# Patient Record
Sex: Female | Born: 1960 | Race: White | Hispanic: No | Marital: Married | Smoking: Former smoker
Health system: Southern US, Community
[De-identification: ages and names within clinical notes are randomized; demographics above are authoritative.]

## PROBLEM LIST (undated history)

## (undated) DIAGNOSIS — I499 Cardiac arrhythmia, unspecified: Secondary | ICD-10-CM

## (undated) DIAGNOSIS — R87619 Unspecified abnormal cytological findings in specimens from cervix uteri: Secondary | ICD-10-CM

## (undated) DIAGNOSIS — Z8679 Personal history of other diseases of the circulatory system: Secondary | ICD-10-CM

## (undated) DIAGNOSIS — S4990XA Unspecified injury of shoulder and upper arm, unspecified arm, initial encounter: Secondary | ICD-10-CM

## (undated) DIAGNOSIS — D649 Anemia, unspecified: Secondary | ICD-10-CM

## (undated) HISTORY — PX: OTHER SURGICAL HISTORY: SHX169

## (undated) HISTORY — DX: Unspecified injury of shoulder and upper arm, unspecified arm, initial encounter: S49.90XA

## (undated) HISTORY — DX: Cardiac arrhythmia, unspecified: I49.9

## (undated) HISTORY — DX: Unspecified abnormal cytological findings in specimens from cervix uteri: R87.619

## (undated) HISTORY — DX: Anemia, unspecified: D64.9

## (undated) HISTORY — DX: Personal history of other diseases of the circulatory system: Z86.79

---
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
## 1986-10-08 DIAGNOSIS — Z8774 Personal history of (corrected) congenital malformations of heart and circulatory system: Secondary | ICD-10-CM

## 1986-10-08 DIAGNOSIS — Z8679 Personal history of other diseases of the circulatory system: Secondary | ICD-10-CM

## 1986-10-08 HISTORY — DX: Personal history of (corrected) congenital malformations of heart and circulatory system: Z87.74

## 1986-10-08 HISTORY — DX: Personal history of other diseases of the circulatory system: Z86.79

## 1987-05-09 HISTORY — PX: ASD REPAIR: SHX258

## 1998-10-08 HISTORY — PX: WISDOM TOOTH EXTRACTION: SHX21

## 2003-05-03 ENCOUNTER — Encounter: Payer: Self-pay | Admitting: Internal Medicine

## 2009-10-08 HISTORY — PX: COLONOSCOPY: SHX174

## 2009-10-08 HISTORY — PX: POLYPECTOMY: SHX149

## 2010-01-20 ENCOUNTER — Encounter (INDEPENDENT_AMBULATORY_CARE_PROVIDER_SITE_OTHER): Payer: Self-pay | Admitting: *Deleted

## 2010-03-03 ENCOUNTER — Encounter (INDEPENDENT_AMBULATORY_CARE_PROVIDER_SITE_OTHER): Payer: Self-pay | Admitting: *Deleted

## 2010-03-08 ENCOUNTER — Ambulatory Visit: Payer: Self-pay | Admitting: Internal Medicine

## 2010-03-15 ENCOUNTER — Ambulatory Visit: Payer: Self-pay | Admitting: Internal Medicine

## 2010-03-19 ENCOUNTER — Encounter: Payer: Self-pay | Admitting: Internal Medicine

## 2010-07-12 ENCOUNTER — Encounter: Payer: Self-pay | Admitting: Internal Medicine

## 2010-07-13 ENCOUNTER — Encounter: Payer: Self-pay | Admitting: Internal Medicine

## 2010-07-14 ENCOUNTER — Encounter: Payer: Self-pay | Admitting: Internal Medicine

## 2010-07-19 ENCOUNTER — Encounter: Payer: Self-pay | Admitting: Internal Medicine

## 2010-07-20 ENCOUNTER — Telehealth: Payer: Self-pay | Admitting: Internal Medicine

## 2010-07-27 ENCOUNTER — Ambulatory Visit: Payer: Self-pay | Admitting: Internal Medicine

## 2010-07-27 ENCOUNTER — Encounter: Payer: Self-pay | Admitting: Internal Medicine

## 2010-07-27 DIAGNOSIS — R110 Nausea: Secondary | ICD-10-CM | POA: Insufficient documentation

## 2010-07-27 DIAGNOSIS — K5730 Diverticulosis of large intestine without perforation or abscess without bleeding: Secondary | ICD-10-CM | POA: Insufficient documentation

## 2010-07-27 DIAGNOSIS — R1013 Epigastric pain: Secondary | ICD-10-CM | POA: Insufficient documentation

## 2010-07-27 DIAGNOSIS — R079 Chest pain, unspecified: Secondary | ICD-10-CM | POA: Insufficient documentation

## 2010-07-27 DIAGNOSIS — R933 Abnormal findings on diagnostic imaging of other parts of digestive tract: Secondary | ICD-10-CM | POA: Insufficient documentation

## 2010-07-31 ENCOUNTER — Encounter: Payer: Self-pay | Admitting: Internal Medicine

## 2010-08-01 ENCOUNTER — Ambulatory Visit (HOSPITAL_COMMUNITY): Admission: RE | Admit: 2010-08-01 | Discharge: 2010-08-01 | Payer: Self-pay | Admitting: Internal Medicine

## 2010-08-09 ENCOUNTER — Telehealth: Payer: Self-pay | Admitting: Physician Assistant

## 2010-08-17 ENCOUNTER — Ambulatory Visit: Payer: Self-pay | Admitting: Internal Medicine

## 2010-11-07 NOTE — Procedures (Signed)
Summary: Colonoscopy  Patient: Vanessa Sanchez Note: All result statuses are Final unless otherwise noted.  Tests: (1) Colonoscopy (COL)   COL Colonoscopy           DONE     [facility]     [address]     [place], [place]  [address]           COLONOSCOPY PROCEDURE REPORT           PATIENT:  Vanessa Sanchez, Vanessa Sanchez  MR#:  [id number]     BIRTHDATE:  Jun 02, 1961, 49 yrs. old  GENDER:  female     ENDOSCOPIST:  Wilhemina Bonito. Eda Keys, MD     REF. BY:  Kari Baars, M.D.     PROCEDURE DATE:  03/15/2010     PROCEDURE:  Colonoscopy with snare polypectomy x 1     ASA CLASS:  Class I     INDICATIONS:  Routine Risk Screening     MEDICATIONS:   Fentanyl 100 mcg IV, Versed 12 mg IV, Benadryl 50     mg IV           DESCRIPTION OF PROCEDURE:   After the risks benefits and     alternatives of the procedure were thoroughly explained, informed     consent was obtained.  Digital rectal exam was performed and     revealed no abnormalities.   The LB PCF-H180AL [id number] endoscope     was introduced through the anus and advanced to the cecum, which     was identified by both the appendix and ileocecal valve, without     limitations.Time to cecum = 7:37 min. The quality of the prep was     good, using MoviPrep.  The instrument was then slowly withdrawn     (time = 18:12 min) as the colon was fully examined.     <<PROCEDUREIMAGES>>           FINDINGS:  A diminutive polyp was found in the sigmoid colon.     Polyp was snared without cautery. Retrieval was successful. snare     polyp  Moderate diverticulosis was found throughout the colon.     This was otherwise a normal examination of the colon.   Retroflexed     views in the rectum revealed no abnormalities.    The scope was     then withdrawn from the patient and the procedure completed.           COMPLICATIONS:  None     ENDOSCOPIC IMPRESSION:     1) Diminutive polyp in the sigmoid colon - removed     2) Moderate diverticulosis throughout the  colon     3) Otherwise normal examination           RECOMMENDATIONS:     1) Repeat colonoscopy in 5 years if polyp adenomatous; otherwise     10 years           ______________________________     Wilhemina Bonito. Eda Keys, MD           CC:  Kari Baars, MD; The Patient           n.     eSIGNED:   Wilhemina Bonito. Eda Keys at 03/15/2010 10:05 AM           Diona Foley, [id number]  Note: An exclamation mark (!) indicates a result that was not dispersed into the flowsheet. Document Creation Date: 03/15/2010 10:07 AM _______________________________________________________________________  (  1) Order result status: Final Collection or observation date-time: 03/15/2010 09:57 Requested date-time:  Receipt date-time:  Reported date-time:  Referring Physician:   Ordering Physician: Fransico Setters [phone]) Specimen Source:  Source: Launa Grill Order Number: [id number] Lab site:   Appended Document: Colonoscopy recall     Procedures Next Due Date:    Colonoscopy: 03/2020

## 2010-11-07 NOTE — Miscellaneous (Signed)
Summary: screening colon age--ch.  Clinical Lists Changes  Medications: Added new medication of MOVIPREP 100 GM  SOLR (PEG-KCL-NACL-NASULF-NA ASC-C) As directed - Signed Rx of MOVIPREP 100 GM  SOLR (PEG-KCL-NACL-NASULF-NA ASC-C) As directed;  #1 x 0;  Signed;  Entered by: Clide Cliff RN;  Authorized by: Hilarie Fredrickson MD;  Method used: [facility]  [place] [address] [place] [address], [place], [place]  [address], Ph: [phone] or [phone], Fax: [phone] Observations: Added new observation of ALLERGY REV: Done (03/08/2010 14:57)    Prescriptions: MOVIPREP 100 GM  SOLR (PEG-KCL-NACL-NASULF-NA ASC-C) As directed  #1 x 0   Entered by:   Clide Cliff RN   Authorized by:   Hilarie Fredrickson MD   Signed by:   Clide Cliff RN on 03/08/2010   Method used:   [facility]ronically to        [facility]  [place] [address] (retail)       [address]       [place], [place]  [address]       Ph: [phone] or [phone]       Fax: [phone]   RxID:   [id number]

## 2010-11-07 NOTE — Procedures (Signed)
Summary: Upper Endoscopy  Patient: [name] Note: All result statuses are Final unless otherwise noted.  Tests: (1) Upper Endoscopy (EGD)   EGD Upper Endoscopy       DONE     [facility]     [address]     [place], [place]  [address]           ENDOSCOPY PROCEDURE REPORT           PATIENT:  Vanessa, Sanchez  MR#:  [id number]     BIRTHDATE:  01-Sep-1961, 49 yrs. old  GENDER:  female           ENDOSCOPIST:  Wilhemina Bonito. Eda Keys, MD     Referred by:  Kari Baars, M.D.           PROCEDURE DATE:  08/17/2010     PROCEDURE:  EGD, diagnostic 43235     ASA CLASS:  Class I     INDICATIONS:  epigastric pain - fullness (some better since OV).     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  The LB GIF-H180 [id number] endoscope was     introduced through the mouth and advanced to the third portion of     the duodenum, without limitations.  The instrument was slowly     withdrawn as the mucosa was fully examined.     <<PROCEDUREIMAGES>>           The upper, middle, and distal third of the esophagus were     carefully inspected and no abnormalities were noted. The z-line     was well seen at the GEJ. The endoscope was pushed into the fundus     which was normal including a retroflexed view. The antrum,gastric     body, first and second part of the duodenum were unremarkable.     Retroflexed views revealed no abnormalities.    The scope was then     withdrawn from the patient and the procedure completed.           COMPLICATIONS:  None           ENDOSCOPIC IMPRESSION:     1) Normal EGD           RECOMMENDATIONS:     1) CONTINUE EXPECTANT MANAGENENT AS SYMPTOMS HAVE IMPROVED          ______________________________     Wilhemina Bonito. Eda Keys, MD           CC:  Kari Baars, MD, The Patient           n.     eSIGNED:   Wilhemina Bonito. Eda Keys at 08/17/2010 04:16 PM           Diona Foley, [id number]  Note: An exclamation mark (!) indicates a result that was not dispersed into the flowsheet. Document Creation Date: 08/17/2010 4:17 PM _______________________________________________________________________  (1) Order result status: Final Collection or observation date-time: 08/17/2010 16:09 Requested date-time:  Receipt date-time:  Reported date-time:  Referring Physician:  Ordering Physician: Fransico Setters [phone]) Specimen Source:  Source: Launa Grill Order Number: [id number] Lab site:

## 2010-11-07 NOTE — Letter (Signed)
Summary: [facility] Instructions  [facility]  [address] [place], [place] [address]   Phone: [phone]  Fax: [phone]       Vanessa Sanchez    1961-08-15    MRN: [id number]        Procedure Day Dorna Bloom: Wednesday 03/15/2010     Arrival Time: 8:00 am      Procedure Time: 9:00 am     Location of Procedure:                    _ x_  [facility] (4th Floor)                        PREPARATION FOR COLONOSCOPY WITH MOVIPREP   Starting 5 days prior to your procedure Friday 6/3 do not eat nuts, seeds, popcorn, corn, beans, peas,  salads, or any raw vegetables.  Do not take any fiber supplements (e.g. Metamucil, Citrucel, and Benefiber).  THE DAY BEFORE YOUR PROCEDURE         DATE: Tuesday 6/7  1.  Drink clear liquids the entire day-NO SOLID FOOD  2.  Do not drink anything colored red or purple.  Avoid juices with pulp.  No orange juice.  3.  Drink at least 64 oz. (8 glasses) of fluid/clear liquids during the day to prevent dehydration and help the prep work efficiently.  CLEAR LIQUIDS INCLUDE: Water Jello Ice Popsicles Tea (sugar ok, no milk/cream) Powdered fruit flavored drinks Coffee (sugar ok, no milk/cream) Gatorade Juice: apple, white grape, white cranberry  Lemonade Clear bullion, consomm, broth Carbonated beverages (any kind) Strained chicken noodle soup Hard Candy                             4.  In the morning, mix first dose of MoviPrep solution:    Empty 1 Pouch A and 1 Pouch B into the disposable container    Add lukewarm drinking water to the top line of the container. Mix to dissolve    Refrigerate (mixed solution should be used within 24 hrs)  5.  Begin drinking the prep at 5:00 p.m. The MoviPrep container is divided by 4 marks.   Every 15 minutes drink the solution down to the next mark (approximately 8 oz) until the full liter is complete.   6.  Follow completed prep with 16 oz of clear liquid of your choice (Nothing red  or purple).  Continue to drink clear liquids until bedtime.  7.  Before going to bed, mix second dose of MoviPrep solution:    Empty 1 Pouch A and 1 Pouch B into the disposable container    Add lukewarm drinking water to the top line of the container. Mix to dissolve    Refrigerate  THE DAY OF YOUR PROCEDURE      DATE: Wednesday 6/8  Beginning at 4:00 a.m. (5 hours before procedure):         1. Every 15 minutes, drink the solution down to the next mark (approx 8 oz) until the full liter is complete.  2. Follow completed prep with 16 oz. of clear liquid of your choice.    3. You may drink clear liquids until 7:00 am (2 HOURS BEFORE PROCEDURE).   MEDICATION INSTRUCTIONS  Unless otherwise instructed, you should take regular prescription medications with a small sip of water   as early as possible the morning of your  procedure.           OTHER INSTRUCTIONS  You will need a responsible adult at least 51 years of age to accompany you and drive you home.   This person must remain in the waiting room during your procedure.  Wear loose fitting clothing that is easily removed.  Leave jewelry and other valuables at home.  However, you may wish to bring a book to read or  an iPod/MP3 player to listen to music as you wait for your procedure to start.  Remove all body piercing jewelry and leave at home.  Total time from sign-in until discharge is approximately 2-3 hours.  You should go home directly after your procedure and rest.  You can resume normal activities the  day after your procedure.  The day of your procedure you should not:   Drive   Make legal decisions   Operate machinery   Drink alcohol   Return to work  You will receive specific instructions about eating, activities and medications before you leave.    The above instructions have been reviewed and explained to me by   Ernestine Conrad, RN______________________    I fully understand and can  verbalize these instructions _____________________________ Date _________

## 2010-11-07 NOTE — Procedures (Deleted)
Summary: LEC COLON   Colonoscopy  Procedure date:  05/03/2003  Findings:      Location:  [facility].   Patient Name: Vanessa Sanchez MRN: [id number] Procedure Procedures: Colonoscopy CPT: 10272.    with polypectomy. CPT: A3573898.  Personnel: Endoscopist: Wilhemina Bonito. Marina Goodell, MD.  Referred By: Valetta Mole Swords, MD.  Exam Location: Exam performed in Outpatient Clinic. Outpatient  Patient Consent: Procedure, Alternatives, Risks and Benefits discussed, consent obtained, from patient. Consent was obtained by the RN.  Indications  Average Risk Screening Routine.  History  Pre-Exam Physical: Performed May 03, 2003. Entire physical exam was normal.  Exam Exam: Extent of exam reached: Cecum, extent intended: Cecum.  The cecum was identified by appendiceal orifice and IC valve. Patient position: on left side. Colon retroflexion performed. Images taken. ASA Classification: II. Tolerance: excellent.  Monitoring: Pulse and BP monitoring, Oximetry used. Supplemental O2 given.  Colon Prep Used Golytely for colon prep. Prep results: good.  Sedation Meds: Patient assessed and found to be appropriate for moderate (conscious) sedation. Fentanyl 50 mcg. given IV. Versed 5 mg. given IV.  Findings NORMAL EXAM: Cecum to Rectum. Comments: internal hemorrhoids present.  POLYP: Ascending Colon, Maximum size: 6 mm. sessile polyp. Procedure:  snare with cautery, removed, retrieved, Polyp sent to pathology. ICD9: Colon Polyps: 211.3.  - DIVERTICULOSIS: Descending Colon to Sigmoid Colon. ICD9: Diverticulosis, Colon: 562.10.   Assessment Abnormal examination, see findings above.  Diagnoses: 211.3: Colon Polyps.  562.10: Diverticulosis, Colon.  455.0: Hemorrhoids, Internal.   Events  Unplanned Interventions: No intervention was required.  Unplanned Events: There were no complications. Plans Patient Education: Patient given standard instructions for: Polyps.   Disposition: After procedure patient sent to recovery. After recovery patient sent home.  Scheduling/Referral: Follow-Up prn.   cc: Valetta Mole. Swords, MD This report was created from the original endoscopy report, which was reviewed and signed by the above listed endoscopist.

## 2010-11-07 NOTE — Progress Notes (Signed)
Summary: Triage Severe abd pain  Phone Note Other Incoming   Caller: Malachi Bonds Dr Clelia Croft Summary of Call: Dr Clelia Croft is requesting a sooner appt, pt has abd and back pain and it "feels like something stuck in chest"  Nausea.  She has had CT and [place].  CT showed severe constipation.  US showed Numerous small hepatic lesions.  pt is requesting test to be run.  she has history of ASD repair open heart.  Neg hemosure.  She just started OTC Prilosec, Zofran.  Dr Marina Goodell do you want her to see extender?  Records are on your desk. Initial call taken by: Chales Abrahams CMA Duncan Dull),  July 20, 2010 9:00 AM Call placed by: Chales Abrahams CMA Duncan Dull),  July 20, 2010 8:54 AM  Follow-up for Phone Call        Extender appt next [facility] w/ Amy (I am supervising) Follow-up by: Hilarie Fredrickson MD,  July 20, 2010 9:21 AM  Additional Follow-up for Phone Call Additional follow up Details #1::        appt made Malachi Bonds will make pt aware Additional Follow-up by: Chales Abrahams CMA Duncan Dull),  July 20, 2010 9:28 AM    New/Updated Medications: PRILOSEC OTC 20 MG TBEC (OMEPRAZOLE MAGNESIUM) 1 by mouth once daily

## 2010-11-07 NOTE — Miscellaneous (Signed)
Summary: Orders Update-MRI ABD  Clinical Lists Changes  Orders: Added new Referral order of MRI Abdomen (MRI Abdomen) - Signed

## 2010-11-07 NOTE — Progress Notes (Signed)
Summary: MRI results  Phone Note Call from Patient Call back at [phone]   Call For: Vanessa Gip, PA Reason for Call: Lab or Test Results Summary of Call: MRI results 1wk ago. Initial call taken by: Leanor Kail [facility],  August 09, 2010 2:11 PM  Follow-up for Phone Call        Called pt and per [facility] PA, I told her that Dr Marina Goodell is  doing some research on some areas of the lver.  She has no history to suggest primary cancer or infection.  I advised Dr. Marina Goodell will discuss the MRI when she comes for the Endoscopy. She thanked me for calling and thanked Dr. Marina Goodell for his research into her matter. Follow-up by: Joselyn Glassman,  August 09, 2010 4:25 PM

## 2010-11-07 NOTE — Letter (Signed)
Summary: Previsit letter  [facility]  [address] [place], [place] [address]   Phone: [phone]  Fax: [phone]       01/20/2010 MRN: [id number]  Vanessa Sanchez [address] [place], [place]  [address]  Dear Ms. Vanessa Sanchez,  Welcome to the Gastroenterology Division at [facility].    You are scheduled to see a nurse for your pre-procedure visit on 03-08-10 at 3:00p.m. on the 3rd floor at [facility], [address]  We ask that you try to arrive at our office 15 minutes prior to your appointment time to allow for check-in.  Your nurse visit will consist of discussing your medical and surgical history, your immediate family medical history, and your medications.    Please bring a complete list of all your medications or, if you prefer, bring the medication bottles and we will list them.  We will need to be aware of both prescribed and over the counter drugs.  We will need to know exact dosage information as well.  If you are on blood thinners (Coumadin, Plavix, Aggrenox, Ticlid, etc.) please call our office today/prior to your appointment, as we need to consult with your physician about holding your medication.   Please be prepared to read and sign documents such as consent forms, a financial agreement, and acknowledgement forms.  If necessary, and with your consent, a friend or relative is welcome to sit-in on the nurse visit with you.  Please bring your insurance card so that we may make a copy of it.  If your insurance requires a referral to see a specialist, please bring your referral form from your primary care physician.  No co-pay is required for this nurse visit.     If you cannot keep your appointment, please call [phone] to cancel or reschedule prior to your appointment date.  This allows [place] the opportunity to schedule an appointment for another patient in need of care.    Thank you for choosing [facility] for your medical  needs.  We appreciate the opportunity to care for you.  Please visit [place] at our website  to learn more about our practice.                     Sincerely.                                                                                                                   The Gastroenterology Division

## 2010-11-07 NOTE — Assessment & Plan Note (Signed)
Summary: Severe abd pain (Dr. Marina Goodell pt)   History of Present Illness Visit Type: consult Primary GI MD: Yancey Flemings MD Primary Jamari Diana: Kari Baars, MD  Requesting Sharnee Douglass: Kari Baars, MD  Chief Complaint: Epigastric pain  History of Present Illness:   VERY NICE 50 Y.O FEMALE KNOWN TO DR. PERRY FROM RECENT COLONSCOPY WHICH WAS DONE IN 6/11 FOR SCREENING. SHE HAS MODERATE DIVERTICULOSIS,AND A DIMUNITIVE POLPY(PATH;POLYPOID COLONIC MUCOSA). SHE COMES IN TODAY WITH C/O EPIGASTRIC PAIN ,PRESENT OVER THE PAST 2-3 MONTHS.THIS PAIN  IS NOW CONSTANT. SHE DESCRIBES IT AS A PRESSURE,OR HEAVY FEELING IN HER LOWER CHEST AND EPIGASTRIUM. IT SEEMS WORSE IN THE EARLY MORNING, AND IS NOT AFFECTED BY by mouth INTAKE. SHE SAYS SHE "FEELS LIKE THERE IS SOMETHING STUCK IN THERE". SHE HAS NO HEARTBURN,NO DYSPHAGIA, OR ODYNOPHAGIA. APPETITE IS OK, WEIGHT STABLE. SHE WAS TREID ON PRILOSEC X 2 WEEKS WITH NO BENEFIT. SHE FEELS THE PAIN INTO HER BACK.,ALSO HAS HAD SOME VAGUE NAUSEA. Marland KitchenBM'S ARE NORMAL, NO HEME.  WORKUP PER DR. SHAW WITH ABDOMINAL US SHOWING NUMEROUS TINY HEPATIC  LESIONS FELT CONSISTENT WITH CYSTS,OTHERWISE NEGATIVE. CT ABDOMEN/PELVIS  SHOWS  CONSTIPATION,INUMERABLE SMALL HEPATIC HYPODENSITIES,MANY TOO SMALL TO CHARACTERIZE AND A ,SIMPLE APPEARING RIGHT UPPER RENAL CYST. LABS REVIEWED FROM 10/6 WITH NORMAL CBC,CMET,LIPASE. SHE FEELS VERY UNCOMFORTABLE ,AND THAT SOMETHING IS WRONG.   GI Review of Systems    Reports abdominal pain, acid reflux, chest pain, and  heartburn.     Location of  Abdominal pain: epigastric area.    Denies belching, bloating, dysphagia with liquids, dysphagia with solids, loss of appetite, nausea, vomiting, vomiting blood, weight loss, and  weight gain.        Denies anal fissure, black tarry stools, change in bowel habit, constipation, diarrhea, diverticulosis, fecal incontinence, heme positive stool, hemorrhoids, irritable bowel syndrome, jaundice, light color stool, liver  problems, rectal bleeding, and  rectal pain.    Current Medications (verified): 1)  Daily Multi  Tabs (Multiple Vitamins-Minerals) .Marland Kitchen.. 1 By Mouth Once Daily  Allergies (verified): No Known Drug Allergies  Past History:  Past Medical History: HX OF ATRIAL SEPTAL DEFECT-S/P REPAIR 1988 L 5 transverse process fracture DIVERTICULOSIS  Past Surgical History: S/P REPAIR ATRIAL SEPTAL DEFECT 1988  Family History: No FH of Colon Cancer:  Social History: [employer] Married Patient is a former smoker.  Alcohol Use - yes: wine daily Daily Caffeine Use: coffee daily  Illicit Drug Use - no Smoking Status:  quit Drug Use:  no  Review of Systems       The patient complains of shortness of breath.  The patient denies allergy/sinus, anemia, anxiety-new, arthritis/joint pain, back pain, blood in urine, breast changes/lumps, change in vision, confusion, cough, coughing up blood, depression-new, fainting, fatigue, fever, headaches-new, hearing problems, heart murmur, heart rhythm changes, itching, menstrual pain, muscle pains/cramps, night sweats, nosebleeds, pregnancy symptoms, skin rash, sleeping problems, sore throat, swelling of feet/legs, swollen lymph glands, thirst - excessive , urination - excessive , urination changes/pain, urine leakage, vision changes, and voice change.         SEE HPI  Vital Signs:  Patient profile:   50 year old female Height:      68 inches Weight:      128 pounds BMI:     19.53 BSA:     1.69 Pulse rate:   74 / minute Pulse rhythm:   regular BP sitting:   98 / 60  (left arm) Cuff size:   regular  Vitals Entered  By: Ok Anis CMA (July 27, 2010 1:50 PM)  Physical Exam  General:  Well developed, well nourished, no acute distress.healthy appearing.   Head:  Normocephalic and atraumatic. Eyes:  PERRLA, no icterus. Lungs:  Clear throughout to auscultation. Heart:  Regular rate and rhythm; no murmurs, rubs,  or bruits. Abdomen:  SOFT, QUITE TENDER  EPIGASTIUM, NO PALP MASS OR HSM, NO GUARDING,BS+,NO BRUIT Rectal:  NOT DONE Extremities:  No clubbing, cyanosis, edema or deformities noted. Neurologic:  Alert and  oriented x4;  grossly normal neurologically. Psych:  Alert and cooperative. Normal mood and affect.   Impression & Recommendations:  Problem # 1:  ABDOMINAL PAIN, EPIGASTRIC (ICD-789.06) Assessment New 49 Y.O FEMALE WITH 2-3 MONTH HX OF PROGRESSIVE EPIGASTRIC /CHEST PAIN,CONSTANT. CT AND US SHOWING NUMEROUS TINY HEPATIC HYPODENSITIES ?CYSTS  . ETIOLOGY OF PAIN IS NOT CLEAR R/O PUD ,GASTRIC OR ESOPHAGEAL LESION.   TRIAL OF HIGH DOSE PPI-NEXIUM 40 MG TWICE DAILY X 2 WEEKS (SAMPLES GIVEN) SCHEDULE FOR UPPER ENDOSCOPY WITH DR. PERRY .PROCEDURE DISCUSSED IN DETAIL WITH PT SCHEDULE FOR MRI OF ABDOMEN ATTN TO LIVER.  Orders: EGD (EGD)  Problem # 2:  DIVERTICULOSIS-COLON (ICD-562.10) Assessment: Comment Only  Other Orders: T-2 View CXR (20254YH)  Patient Instructions: 1)  Copy sent to : Kari Baars, MD  2)  You are scheduled for Endoscopy for November 10 at 4pm 3)  You are scheduled for a MRI at [facility] on Tuesday October 25 at 1:00pm and arrive at 12:45pm to register 4)  You are scheduled for a chest x-ray today  5)  Nexium samples have been given to take one by mouth two times a day  6)  Upper Endoscopy brochure given 7)  Conscious Sedation brochure given 8)  [facility] Patient Information Guide given to patient.  9)  The medication list was reviewed and reconciled.  All changed / newly prescribed medications were explained.  A complete medication list was provided to the patient / caregiver.

## 2010-11-07 NOTE — Letter (Signed)
Summary: Phone Note/[facility]  Phone Note/[facility]   Imported By: Sherian Rein 08/01/2010 08:37:09  _____________________________________________________________________  External Attachment:    Type:   Image     Comment:   External Document

## 2010-11-07 NOTE — Letter (Signed)
Summary: [facility]  [facility]   Imported By: Lester Conrath 08/04/2010 09:24:05  _____________________________________________________________________  External Attachment:    Type:   Image     Comment:   External Document

## 2010-11-07 NOTE — Letter (Signed)
Summary: EGD Instructions  [facility]  [address] [place], [place] [address]   Phone: [phone]  Fax: [phone]       Vanessa Sanchez    [birth year]    MRN: [id number]       Procedure Day /Date:Thursday/ 08-17-2010     Arrival Time:3pm     Procedure Time:4pm     Location of Procedure:                    [facility] (4th Floor)    PREPARATION FOR ENDOSCOPY   On Thursday/08-17-2010 THE DAY OF THE PROCEDURE:  1.   No solid foods, milk or milk products are allowed after midnight the night before your procedure.  2.   Do not drink anything colored red or purple.  Avoid juices with pulp.  No orange juice.  3.  You may drink clear liquids until 1pm which is 2 hours before your procedure.                                                                                                CLEAR LIQUIDS INCLUDE: Water Jello Ice Popsicles Tea (sugar ok, no milk/cream) Powdered fruit flavored drinks Coffee (sugar ok, no milk/cream) Gatorade Juice: apple, white grape, white cranberry  Lemonade Clear bullion, consomm, broth Carbonated beverages (any kind) Strained chicken noodle soup Hard Candy   MEDICATION INSTRUCTIONS Unless otherwise instructed, you should take regular prescription medications with a small sip of water as early as possible the morning of your procedure    OTHER INSTRUCTIONS  You will need a responsible adult at least 51 years of age to accompany you and drive you home.   This person must remain in the waiting room during your procedure.  Wear loose fitting clothing that is easily removed.  Leave jewelry and other valuables at home.  However, you may wish to bring a book to read or an iPod/MP3 player to listen to music as you wait for your procedure to start.  Remove all body piercing jewelry and leave at home.  Total time from sign-in until discharge is approximately 2-3 hours.  You should go home directly after your  procedure and rest.  You can resume normal activities the day after your procedure.  The day of your procedure you should not:   Drive   Make legal decisions   Operate machinery   Drink alcohol   Return to work  You will receive specific instructions about eating, activities and medications before you leave.    The above instructions have been reviewed and explained to me by   _______________________    I fully understand and can verbalize these instructions _____________________________ Date _________

## 2010-11-07 NOTE — Letter (Signed)
Summary: Patient Notice- Polyp Results  [facility]  [address] [place], [place] [address]   Phone: [phone]  Fax: [phone]        March 19, 2010 MRN: [id number]    Vanessa Sanchez [address] [place], [place]  [address]    Dear [name],  I am pleased to inform you that the colon polyp(s) removed during your recent colonoscopy was (were) found to be benign (no cancer detected) upon pathologic examination.  I recommend you have a repeat colonoscopy examination in 10 years to look for recurrent polyps, as having colon polyps increases your risk for having recurrent polyps or even colon cancer in the future.  Should you develop new or worsening symptoms of abdominal pain, bowel habit changes or bleeding from the rectum or bowels, please schedule an evaluation with either your primary care physician or with me.  Additional information/recommendations:  __ No further action with gastroenterology is needed at this time. Please      follow-up with your primary care physician for your other healthcare      needs.  Please call us if you are having persistent problems or have questions about your condition that have not been fully answered at this time.  Sincerely,  Hilarie Fredrickson MD  This letter has been electronically signed by your physician.  Appended Document: Patient Notice- Polyp Results letter mailed.

## 2010-11-07 NOTE — Letter (Signed)
Summary: [facility]  [facility]   Imported By: Sherian Rein 08/01/2010 08:36:10  _____________________________________________________________________  External Attachment:    Type:   Image     Comment:   External Document

## 2010-11-10 NOTE — Letter (Signed)
Summary: Pt's Hx/[facility]  Pt's Hx/[facility]   Imported By: Sherian Rein 08/01/2010 08:31:23  _____________________________________________________________________  External Attachment:    Type:   Image     Comment:   External Document

## 2011-09-14 ENCOUNTER — Emergency Department (HOSPITAL_COMMUNITY): Payer: No Typology Code available for payment source

## 2011-09-14 ENCOUNTER — Encounter: Payer: Self-pay | Admitting: Emergency Medicine

## 2011-09-14 ENCOUNTER — Emergency Department (HOSPITAL_COMMUNITY)
Admission: EM | Admit: 2011-09-14 | Discharge: 2011-09-14 | Disposition: A | Discharge status: Home / Self Care | Payer: No Typology Code available for payment source | Attending: Emergency Medicine | Admitting: Emergency Medicine

## 2011-09-14 DIAGNOSIS — S52599A Other fractures of lower end of unspecified radius, initial encounter for closed fracture: Secondary | ICD-10-CM | POA: Insufficient documentation

## 2011-09-14 DIAGNOSIS — R079 Chest pain, unspecified: Secondary | ICD-10-CM | POA: Insufficient documentation

## 2011-09-14 DIAGNOSIS — M545 Low back pain, unspecified: Secondary | ICD-10-CM | POA: Insufficient documentation

## 2011-09-14 DIAGNOSIS — Y92410 Unspecified street and highway as the place of occurrence of the external cause: Secondary | ICD-10-CM | POA: Insufficient documentation

## 2011-09-14 DIAGNOSIS — M542 Cervicalgia: Secondary | ICD-10-CM | POA: Insufficient documentation

## 2011-09-14 DIAGNOSIS — M25569 Pain in unspecified knee: Secondary | ICD-10-CM | POA: Insufficient documentation

## 2011-09-14 DIAGNOSIS — S52509A Unspecified fracture of the lower end of unspecified radius, initial encounter for closed fracture: Secondary | ICD-10-CM

## 2011-09-14 MED ORDER — MORPHINE SULFATE 2 MG/ML IJ SOLN
INTRAMUSCULAR | Status: AC
  Filled 2011-09-14: qty 1

## 2011-09-14 MED ORDER — OXYCODONE-ACETAMINOPHEN 5-325 MG PO TABS: 1.0000 | ORAL_TABLET | ORAL | Status: AC | PRN

## 2011-09-14 MED ORDER — MORPHINE SULFATE 4 MG/ML IJ SOLN
6.0000 mg | Freq: Once | INTRAMUSCULAR | Status: AC
  Administered 2011-09-14: 4 mg via INTRAVENOUS

## 2011-09-14 MED ORDER — MORPHINE SULFATE 2 MG/ML IJ SOLN
INTRAMUSCULAR | Status: AC
  Filled 2011-09-14: qty 2

## 2011-09-14 MED ORDER — MORPHINE SULFATE 4 MG/ML IJ SOLN
6.0000 mg | Freq: Once | INTRAMUSCULAR | Status: AC
  Administered 2011-09-14: 6 mg via INTRAVENOUS
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
## 2011-09-14 NOTE — ED Provider Notes (Signed)
Medical screening examination/treatment/procedure(s) were performed by non-physician practitioner and as supervising physician I was immediately available for consultation/collaboration.  Doug Sou, MD 09/14/11 [phone]
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
## 2011-10-09 DIAGNOSIS — S4990XA Unspecified injury of shoulder and upper arm, unspecified arm, initial encounter: Secondary | ICD-10-CM

## 2011-10-09 HISTORY — DX: Unspecified injury of shoulder and upper arm, unspecified arm, initial encounter: S49.90XA

## 2012-09-13 HISTORY — PX: OTHER SURGICAL HISTORY: SHX169

## 2013-12-14 ENCOUNTER — Encounter: Payer: Self-pay | Admitting: Obstetrics & Gynecology

## 2013-12-15 ENCOUNTER — Ambulatory Visit: Payer: Self-pay | Admitting: Obstetrics & Gynecology

## 2013-12-15 ENCOUNTER — Ambulatory Visit: Payer: Self-pay | Admitting: Obstetrics and Gynecology

## 2013-12-16 ENCOUNTER — Telehealth: Payer: Self-pay | Admitting: Obstetrics & Gynecology

## 2013-12-16 NOTE — Telephone Encounter (Signed)
Patient says she is returning a call to [place] from yesterday. Patient says this is regarding changing an appointment with Dr. Hyacinth MeekerMiller.

## 2013-12-18 NOTE — Telephone Encounter (Signed)
AEX rescheduled to 01-11-14 at 4pm with Dr Hyacinth MeekerMiller.  Routing to provider for final review. Patient agreeable to disposition. Will close encounter

## 2014-01-11 ENCOUNTER — Encounter: Payer: Self-pay | Admitting: Obstetrics & Gynecology

## 2014-01-11 ENCOUNTER — Ambulatory Visit (INDEPENDENT_AMBULATORY_CARE_PROVIDER_SITE_OTHER): Payer: BC Managed Care – PPO | Admitting: Obstetrics & Gynecology

## 2014-01-11 VITALS — BP 102/70 | HR 56 | Resp 16 | Ht 66.5 in | Wt 123.0 lb

## 2014-01-11 DIAGNOSIS — Z0000 Encounter for general adult medical examination without abnormal findings: Secondary | ICD-10-CM

## 2014-01-11 DIAGNOSIS — Z01419 Encounter for gynecological examination (general) (routine) without abnormal findings: Secondary | ICD-10-CM

## 2014-01-11 LAB — POCT URINALYSIS DIPSTICK
Bilirubin, UA: NEGATIVE
Blood, UA: NEGATIVE
GLUCOSE UA: NEGATIVE
Ketones, UA: NEGATIVE
LEUKOCYTES UA: NEGATIVE
NITRITE UA: NEGATIVE
Protein, UA: NEGATIVE
UROBILINOGEN UA: NEGATIVE
pH, UA: 5

## 2014-01-11 LAB — HEMOGLOBIN, FINGERSTICK: HEMOGLOBIN, FINGERSTICK: 12.9 g/dL (ref 12.0–16.0)

## 2014-01-11 MED ORDER — MEFLOQUINE HCL 250 MG PO TABS: 250.0000 mg | ORAL_TABLET | ORAL | Status: DC

## 2014-01-11 MED ORDER — CIPROFLOXACIN HCL 500 MG PO TABS: 500.0000 mg | ORAL_TABLET | Freq: Two times a day (BID) | ORAL | Status: DC
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
## 2014-01-11 NOTE — Patient Instructions (Signed)

## 2014-08-09 ENCOUNTER — Encounter: Payer: Self-pay | Admitting: Obstetrics & Gynecology

## 2015-01-27 ENCOUNTER — Emergency Department (HOSPITAL_COMMUNITY)
Admission: EM | Admit: 2015-01-27 | Discharge: 2015-01-27 | Disposition: A | Discharge status: Home / Self Care | Payer: BLUE CROSS/BLUE SHIELD | Attending: Emergency Medicine | Admitting: Emergency Medicine

## 2015-01-27 ENCOUNTER — Encounter (HOSPITAL_COMMUNITY): Payer: Self-pay | Admitting: Cardiology

## 2015-01-27 DIAGNOSIS — Z8679 Personal history of other diseases of the circulatory system: Secondary | ICD-10-CM | POA: Insufficient documentation

## 2015-01-27 DIAGNOSIS — Z862 Personal history of diseases of the blood and blood-forming organs and certain disorders involving the immune mechanism: Secondary | ICD-10-CM | POA: Diagnosis not present

## 2015-01-27 DIAGNOSIS — Z87891 Personal history of nicotine dependence: Secondary | ICD-10-CM | POA: Diagnosis not present

## 2015-01-27 DIAGNOSIS — Z79899 Other long term (current) drug therapy: Secondary | ICD-10-CM | POA: Insufficient documentation

## 2015-01-27 DIAGNOSIS — Z792 Long term (current) use of antibiotics: Secondary | ICD-10-CM | POA: Diagnosis not present

## 2015-01-27 DIAGNOSIS — I499 Cardiac arrhythmia, unspecified: Secondary | ICD-10-CM | POA: Diagnosis not present

## 2015-01-27 DIAGNOSIS — R040 Epistaxis: Secondary | ICD-10-CM

## 2015-01-27 MED ORDER — MUPIROCIN CALCIUM 2 % NA OINT: TOPICAL_OINTMENT | NASAL | Status: DC

## 2015-01-27 MED ORDER — OXYMETAZOLINE HCL 0.05 % NA SOLN: 1.0000 | Freq: Once | NASAL | Status: DC

## 2015-01-27 MED ORDER — OXYMETAZOLINE HCL 0.05 % NA SOLN
1.0000 | Freq: Once | NASAL | Status: AC
  Administered 2015-01-27: 1 via NASAL
  Filled 2015-01-27: qty 15

## 2015-01-27 NOTE — ED Notes (Signed)
Patient states 3 x nosebleeds in the last week.   Had most recent one today and it lasted approximately 15 minutes.  Patient states no history of allergies, nosebleeds, high bp.   No bleeding at this time.
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
## 2015-01-27 NOTE — ED Notes (Signed)
Pt reports that she has had 3 nose bleeds this week. Reports no hx of the same. Denies any blood thinners. States she called her PCP and was told to come here for further evaluation. Reports a headache

## 2015-01-27 NOTE — Discharge Instructions (Signed)
You have an appointment tomorrow with Dr. Jearld Fenton at 2 PM at [facility] ear, nose and throat.. If you develop a nosebleed again soaked gauze with Afrin and placed in the left nostril and apply steady pressure for 20 minutes without letting up. He will need to apply Bactroban ointment to the nose twice daily for the next 2 weeks. Please read the information below on nasal hygiene. You need to be using saline and then nose up to 6 times a day.\  Nosebleed Nosebleeds can be caused by many conditions, including trauma, infections, polyps, foreign bodies, dry mucous membranes or climate, medicines, and air conditioning. Most nosebleeds occur in the front of the nose. Because of this location, most nosebleeds can be controlled by pinching the nostrils gently and continuously for at least 10 to 20 minutes. The long, continuous pressure allows enough time for the blood to clot. If pressure is released during that 10 to 20 minute time period, the process may have to be started again. The nosebleed may stop by itself or quit with pressure, or it may need concentrated heating (cautery) or pressure from packing. HOME CARE INSTRUCTIONS   If your nose was packed, try to maintain the pack inside until your health care provider removes it. If a gauze pack was used and it starts to fall out, gently replace it or cut the end off. Do not cut if a balloon catheter was used to pack the nose. Otherwise, do not remove unless instructed.  Avoid blowing your nose for 12 hours after treatment. This could dislodge the pack or clot and start the bleeding again.  If the bleeding starts again, sit up and bend forward, gently pinching the front half of your nose continuously for 20 minutes.  If bleeding was caused by dry mucous membranes, use over-the-counter saline nasal spray or gel. This will keep the mucous membranes moist and allow them to heal. If you must use a lubricant, choose the water-soluble variety. Use it only sparingly  and not within several hours of lying down.  Do not use petroleum jelly or mineral oil, as these may drip into the lungs and cause serious problems.  Maintain humidity in your home by using less air conditioning or by using a humidifier.  Do not use aspirin or medicines which make bleeding more likely. Your health care provider can give you recommendations on this.  Resume normal activities as you are able, but try to avoid straining, lifting, or bending at the waist for several days.  If the nosebleeds become recurrent and the cause is unknown, your health care provider may suggest laboratory tests. SEEK MEDICAL CARE IF: You have a fever. SEEK IMMEDIATE MEDICAL CARE IF:   Bleeding recurs and cannot be controlled.  There is unusual bleeding from or bruising on other parts of the body.  Nosebleeds continue.  There is any worsening of the condition which originally brought you in.  You become light-headed, feel faint, become sweaty, or vomit blood. MAKE SURE YOU:   Understand these instructions.  Will watch your condition.  Will get help right away if you are not doing well or get worse. Document Released: 07/04/2005 Document Revised: 02/08/2014 Document Reviewed: 08/25/2009 [facility] Patient Information 2015 [place], [place]. This information is not intended to replace advice given to you by your health care provider. Make sure you discuss any questions you have with your health care provider.  Nasal Hygiene The nose has many positive effects on the air you breathe in that you  may not be aware of. - Temperature regulation - Filtration and removal of particulate matter - Humidification - Defense against infections There are several things you can do to help keep your nose healthy. Foremost is nasal hygiene. This will help with your nose's natural function and keep it moist and healthy. Techniques to accomplish this are outlined below. These will help with nasal dryness, nasal  crusting, and nose bleeds. They also assist with clearing thick mucus that cause you to blow your nose frequently and may be associated with thick postnasal drip.  1. Use nasal saline daily. You can buy small bottles of this over-the-counter at the drug store or grocery store. Some brand names are: [address], [employer] Mist, [place]. Apply 2-3 sprays each nostril several times a day. If your nose feels dry or have had recent nasal surgery, try to use it every couple of hours. There is no medicine in it so it can be used as often as you like. Do NOT use the sprays containing decongestants. The appropriate way to apply nasal sprays: Place the nozzle just inside your nostril and point it towards the corner of your eye. Often, it is helpful to use the right-hand to spray into the left nasal cavity and use the left-hand to spray into the right side.  2. Use nasal saline irrigations and flushing.SinuCleanse, Simply Saline, [place]. Use this 1-2 times a day. We can also provide you with a recipe to use at home. Just ask us. To prevent reintroducing bacteria back into your nose, please keep your irrigation equipment clean and dry between uses. Throw away and replace reusable irrigation equipment every 3 weeks.   3. Use Vaseline petroleum jelly or Aquaphor. You can apply this gently to each nostril 2-3 times a day to promote moisturization for your nose. You may also use triple antibiotic ointment such as Neosporin or Bacitracin. These can all be bought over-the-counter.  4. Consider other nasal emollients. A few preparations are available over-thecounter. Ponaris, Nose Better, Pretz. Ask your pharmacist what is available. Also, some nasal saline sprays have additives such as aloe and these are helpful.  5. Consider using a humidifier at home. If your nose feels dry and/or you have frequent nose bleeds, you can buy a humidifier for your home. Be cautious in using these if you have mold allergies.  6. Avoid  excessive manual manipulation of your nose and nostrils. Frequent rubbing of your nostrils and the passing of tissues or fingers in your nostrils may aggravate nasal irritation from dryness and nose bleeds.

## 2015-05-12 ENCOUNTER — Encounter: Payer: Self-pay | Admitting: Internal Medicine

## 2015-05-20 ENCOUNTER — Ambulatory Visit (INDEPENDENT_AMBULATORY_CARE_PROVIDER_SITE_OTHER): Payer: BLUE CROSS/BLUE SHIELD | Admitting: Obstetrics and Gynecology

## 2015-05-20 ENCOUNTER — Encounter: Payer: Self-pay | Admitting: Obstetrics and Gynecology

## 2015-05-20 VITALS — BP 120/72 | HR 56 | Resp 16 | Ht 66.75 in | Wt 128.0 lb

## 2015-05-20 DIAGNOSIS — Z01419 Encounter for gynecological examination (general) (routine) without abnormal findings: Secondary | ICD-10-CM | POA: Diagnosis not present

## 2015-05-20 DIAGNOSIS — Z0000 Encounter for general adult medical examination without abnormal findings: Secondary | ICD-10-CM | POA: Diagnosis not present

## 2015-05-20 LAB — POCT URINALYSIS DIPSTICK
Bilirubin, UA: NEGATIVE
Blood, UA: NEGATIVE
Glucose, UA: NEGATIVE
Ketones, UA: NEGATIVE
Nitrite, UA: NEGATIVE
Protein, UA: NEGATIVE
Urobilinogen, UA: NEGATIVE
pH, UA: 7
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
## 2015-05-20 NOTE — Patient Instructions (Signed)

## 2015-05-26 LAB — IPS PAP TEST WITH HPV

## 2015-09-07 ENCOUNTER — Other Ambulatory Visit (HOSPITAL_COMMUNITY): Payer: Self-pay | Admitting: Respiratory Therapy

## 2015-09-07 DIAGNOSIS — R0609 Other forms of dyspnea: Secondary | ICD-10-CM

## 2015-10-18 ENCOUNTER — Encounter (HOSPITAL_COMMUNITY): Payer: BLUE CROSS/BLUE SHIELD

## 2016-01-27 ENCOUNTER — Telehealth: Payer: Self-pay | Admitting: Obstetrics and Gynecology

## 2016-01-27 NOTE — Telephone Encounter (Signed)
Left patient a message to call back to reschedule a future appointment that was cancelled by the provider. °

## 2016-05-23 ENCOUNTER — Ambulatory Visit (INDEPENDENT_AMBULATORY_CARE_PROVIDER_SITE_OTHER): Payer: PRIVATE HEALTH INSURANCE | Admitting: Obstetrics and Gynecology

## 2016-05-23 ENCOUNTER — Encounter: Payer: Self-pay | Admitting: Obstetrics and Gynecology

## 2016-05-23 VITALS — BP 116/70 | HR 60 | Resp 14 | Ht 66.75 in | Wt 130.2 lb

## 2016-05-23 DIAGNOSIS — Z0000 Encounter for general adult medical examination without abnormal findings: Secondary | ICD-10-CM | POA: Diagnosis not present

## 2016-05-23 DIAGNOSIS — R300 Dysuria: Secondary | ICD-10-CM | POA: Diagnosis not present

## 2016-05-23 DIAGNOSIS — Z01419 Encounter for gynecological examination (general) (routine) without abnormal findings: Secondary | ICD-10-CM | POA: Diagnosis not present

## 2016-05-23 LAB — POCT URINALYSIS DIPSTICK
Bilirubin, UA: NEGATIVE
Glucose, UA: NEGATIVE
Ketones, UA: NEGATIVE
Nitrite, UA: NEGATIVE
PH UA: 6
RBC UA: NEGATIVE
UROBILINOGEN UA: NEGATIVE

## 2016-05-23 MED ORDER — SULFAMETHOXAZOLE-TRIMETHOPRIM 800-160 MG PO TABS: 1.0000 | ORAL_TABLET | Freq: Two times a day (BID) | ORAL | 0 refills | Status: DC

## 2016-05-23 NOTE — Patient Instructions (Signed)
Health Maintenance, Female Adopting a healthy lifestyle and getting preventive care can go a long way to promote health and wellness. Talk with your health care provider about what schedule of regular examinations is right for you. This is a good chance for you to check in with your provider about disease prevention and staying healthy. In between checkups, there are plenty of things you can do on your own. Experts have done a lot of research about which lifestyle changes and preventive measures are most likely to keep you healthy. Ask your health care provider for more information. WEIGHT AND DIET  Eat a healthy diet  Be sure to include plenty of vegetables, fruits, low-fat dairy products, and lean protein.  Do not eat a lot of foods high in solid fats, added sugars, or salt.  Get regular exercise. This is one of the most important things you can do for your health.  Most adults should exercise for at least 150 minutes each week. The exercise should increase your heart rate and make you sweat (moderate-intensity exercise).  Most adults should also do strengthening exercises at least twice a week. This is in addition to the moderate-intensity exercise.  Maintain a healthy weight  Body mass index (BMI) is a measurement that can be used to identify possible weight problems. It estimates body fat based on height and weight. Your health care provider can help determine your BMI and help you achieve or maintain a healthy weight.  For females 45 years of age and older:   A BMI below 18.5 is considered underweight.  A BMI of 18.5 to 24.9 is normal.  A BMI of 25 to 29.9 is considered overweight.  A BMI of 30 and above is considered obese.  Watch levels of cholesterol and blood lipids  You should start having your blood tested for lipids and cholesterol at 56 years of age, then have this test every 5 years.  You may need to have your cholesterol levels checked more often if:  Your lipid  or cholesterol levels are high.  You are older than 56 years of age.  You are at high risk for heart disease.  CANCER SCREENING   Lung Cancer  Lung cancer screening is recommended for adults 81-39 years old who are at high risk for lung cancer because of a history of smoking.  A yearly low-dose CT scan of the lungs is recommended for people who:  Currently smoke.  Have quit within the past 15 years.  Have at least a 30-pack-year history of smoking. A pack year is smoking an average of one pack of cigarettes a day for 1 year.  Yearly screening should continue until it has been 15 years since you quit.  Yearly screening should stop if you develop a health problem that would prevent you from having lung cancer treatment.  Breast Cancer  Practice breast self-awareness. This means understanding how your breasts normally appear and feel.  It also means doing regular breast self-exams. Let your health care provider know about any changes, no matter how small.  If you are in your 20s or 30s, you should have a clinical breast exam (CBE) by a health care provider every 1-3 years as part of a regular health exam.  If you are 60 or older, have a CBE every year. Also consider having a breast X-ray (mammogram) every year.  If you have a family history of breast cancer, talk to your health care provider about genetic screening.  If you  are at high risk for breast cancer, talk to your health care provider about having an MRI and a mammogram every year.  Breast cancer gene (BRCA) assessment is recommended for women who have family members with BRCA-related cancers. BRCA-related cancers include:  Breast.  Ovarian.  Tubal.  Peritoneal cancers.  Results of the assessment will determine the need for genetic counseling and BRCA1 and BRCA2 testing. Cervical Cancer Your health care provider may recommend that you be screened regularly for cancer of the pelvic organs (ovaries, uterus, and  vagina). This screening involves a pelvic examination, including checking for microscopic changes to the surface of your cervix (Pap test). You may be encouraged to have this screening done every 3 years, beginning at age 21.  For women ages 30-65, health care providers may recommend pelvic exams and Pap testing every 3 years, or they may recommend the Pap and pelvic exam, combined with testing for human papilloma virus (HPV), every 5 years. Some types of HPV increase your risk of cervical cancer. Testing for HPV may also be done on women of any age with unclear Pap test results.  Other health care providers may not recommend any screening for nonpregnant women who are considered low risk for pelvic cancer and who do not have symptoms. Ask your health care provider if a screening pelvic exam is right for you.  If you have had past treatment for cervical cancer or a condition that could lead to cancer, you need Pap tests and screening for cancer for at least 20 years after your treatment. If Pap tests have been discontinued, your risk factors (such as having a new sexual partner) need to be reassessed to determine if screening should resume. Some women have medical problems that increase the chance of getting cervical cancer. In these cases, your health care provider may recommend more frequent screening and Pap tests. Colorectal Cancer  This type of cancer can be detected and often prevented.  Routine colorectal cancer screening usually begins at 56 years of age and continues through 56 years of age.  Your health care provider may recommend screening at an earlier age if you have risk factors for colon cancer.  Your health care provider may also recommend using home test kits to check for hidden blood in the stool.  A small camera at the end of a tube can be used to examine your colon directly (sigmoidoscopy or colonoscopy). This is done to check for the earliest forms of colorectal  cancer.  Routine screening usually begins at age 50.  Direct examination of the colon should be repeated every 5-10 years through 56 years of age. However, you may need to be screened more often if early forms of precancerous polyps or small growths are found. Skin Cancer  Check your skin from head to toe regularly.  Tell your health care provider about any new moles or changes in moles, especially if there is a change in a mole's shape or color.  Also tell your health care provider if you have a mole that is larger than the size of a pencil eraser.  Always use sunscreen. Apply sunscreen liberally and repeatedly throughout the day.  Protect yourself by wearing long sleeves, pants, a wide-brimmed hat, and sunglasses whenever you are outside. HEART DISEASE, DIABETES, AND HIGH BLOOD PRESSURE   High blood pressure causes heart disease and increases the risk of stroke. High blood pressure is more likely to develop in:  People who have blood pressure in the high end   of the normal range (130-139/85-89 mm Hg).  People who are overweight or obese.  People who are African American.  If you are 38-23 years of age, have your blood pressure checked every 3-5 years. If you are 61 years of age or older, have your blood pressure checked every year. You should have your blood pressure measured twice--once when you are at a hospital or clinic, and once when you are not at a hospital or clinic. Record the average of the two measurements. To check your blood pressure when you are not at a hospital or clinic, you can use:  An automated blood pressure machine at a pharmacy.  A home blood pressure monitor.  If you are between 45 years and 39 years old, ask your health care provider if you should take aspirin to prevent strokes.  Have regular diabetes screenings. This involves taking a blood sample to check your fasting blood sugar level.  If you are at a normal weight and have a low risk for diabetes,  have this test once every three years after 56 years of age.  If you are overweight and have a high risk for diabetes, consider being tested at a younger age or more often. PREVENTING INFECTION  Hepatitis B  If you have a higher risk for hepatitis B, you should be screened for this virus. You are considered at high risk for hepatitis B if:  You were born in a country where hepatitis B is common. Ask your health care provider which countries are considered high risk.  Your parents were born in a high-risk country, and you have not been immunized against hepatitis B (hepatitis B vaccine).  You have HIV or AIDS.  You use needles to inject street drugs.  You live with someone who has hepatitis B.  You have had sex with someone who has hepatitis B.  You get hemodialysis treatment.  You take certain medicines for conditions, including cancer, organ transplantation, and autoimmune conditions. Hepatitis C  Blood testing is recommended for:  Everyone born from [birth year] through 1966.  Anyone with known risk factors for hepatitis C. Sexually transmitted infections (STIs)  You should be screened for sexually transmitted infections (STIs) including gonorrhea and chlamydia if:  You are sexually active and are younger than 56 years of age.  You are older than 56 years of age and your health care provider tells you that you are at risk for this type of infection.  Your sexual activity has changed since you were last screened and you are at an increased risk for chlamydia or gonorrhea. Ask your health care provider if you are at risk.  If you do not have HIV, but are at risk, it may be recommended that you take a prescription medicine daily to prevent HIV infection. This is called pre-exposure prophylaxis (PrEP). You are considered at risk if:  You are sexually active and do not regularly use condoms or know the HIV status of your partner(s).  You take drugs by injection.  You are sexually  active with a partner who has HIV. Talk with your health care provider about whether you are at high risk of being infected with HIV. If you choose to begin PrEP, you should first be tested for HIV. You should then be tested every 3 months for as long as you are taking PrEP.  PREGNANCY   If you are premenopausal and you may become pregnant, ask your health care provider about preconception counseling.  If you may  become pregnant, take 400 to 800 micrograms (mcg) of folic acid every day.  If you want to prevent pregnancy, talk to your health care provider about birth control (contraception). OSTEOPOROSIS AND MENOPAUSE   Osteoporosis is a disease in which the bones lose minerals and strength with aging. This can result in serious bone fractures. Your risk for osteoporosis can be identified using a bone density scan.  If you are 61 years of age or older, or if you are at risk for osteoporosis and fractures, ask your health care provider if you should be screened.  Ask your health care provider whether you should take a calcium or vitamin D supplement to lower your risk for osteoporosis.  Menopause may have certain physical symptoms and risks.  Hormone replacement therapy may reduce some of these symptoms and risks. Talk to your health care provider about whether hormone replacement therapy is right for you.  HOME CARE INSTRUCTIONS   Schedule regular health, dental, and eye exams.  Stay current with your immunizations.   Do not use any tobacco products including cigarettes, chewing tobacco, or electronic cigarettes.  If you are pregnant, do not drink alcohol.  If you are breastfeeding, limit how much and how often you drink alcohol.  Limit alcohol intake to no more than 1 drink per day for nonpregnant women. One drink equals 12 ounces of beer, 5 ounces of wine, or 1 ounces of hard liquor.  Do not use street drugs.  Do not share needles.  Ask your health care provider for help if  you need support or information about quitting drugs.  Tell your health care provider if you often feel depressed.  Tell your health care provider if you have ever been abused or do not feel safe at home.   This information is not intended to replace advice given to you by your health care provider. Make sure you discuss any questions you have with your health care provider.   Document Released: 04/09/2011 Document Revised: 10/15/2014 Document Reviewed: 08/26/2013 Elsevier Interactive Patient Education [employer].
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
## 2016-05-25 LAB — URINE CULTURE: Organism ID, Bacteria: 10000

## 2016-06-06 ENCOUNTER — Ambulatory Visit: Payer: BLUE CROSS/BLUE SHIELD | Admitting: Obstetrics and Gynecology

## 2016-08-24 ENCOUNTER — Other Ambulatory Visit: Payer: Self-pay | Admitting: Internal Medicine

## 2016-08-24 DIAGNOSIS — F17201 Nicotine dependence, unspecified, in remission: Secondary | ICD-10-CM

## 2017-06-21 ENCOUNTER — Ambulatory Visit: Payer: PRIVATE HEALTH INSURANCE | Admitting: Obstetrics and Gynecology

## 2017-07-22 ENCOUNTER — Ambulatory Visit (INDEPENDENT_AMBULATORY_CARE_PROVIDER_SITE_OTHER): Payer: PRIVATE HEALTH INSURANCE | Admitting: Obstetrics and Gynecology

## 2017-07-22 ENCOUNTER — Other Ambulatory Visit (HOSPITAL_COMMUNITY)
Admission: RE | Admit: 2017-07-22 | Discharge: 2017-07-22 | Disposition: A | Discharge status: Home / Self Care | Payer: PRIVATE HEALTH INSURANCE | Source: Ambulatory Visit | Attending: Obstetrics and Gynecology | Admitting: Obstetrics and Gynecology

## 2017-07-22 ENCOUNTER — Encounter: Payer: Self-pay | Admitting: Obstetrics and Gynecology

## 2017-07-22 VITALS — BP 110/70 | HR 72 | Resp 16 | Ht 67.25 in | Wt 128.0 lb

## 2017-07-22 DIAGNOSIS — Z01419 Encounter for gynecological examination (general) (routine) without abnormal findings: Secondary | ICD-10-CM | POA: Insufficient documentation

## 2017-07-22 NOTE — Patient Instructions (Signed)
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
## 2017-07-24 LAB — CYTOLOGY - PAP
DIAGNOSIS: NEGATIVE
HPV: NOT DETECTED

## 2017-11-07 ENCOUNTER — Other Ambulatory Visit: Payer: Self-pay | Admitting: Internal Medicine

## 2017-11-07 DIAGNOSIS — Z87891 Personal history of nicotine dependence: Secondary | ICD-10-CM

## 2017-12-06 ENCOUNTER — Ambulatory Visit: Payer: BLUE CROSS/BLUE SHIELD

## 2018-01-10 ENCOUNTER — Ambulatory Visit
Admission: RE | Admit: 2018-01-10 | Discharge: 2018-01-10 | Disposition: A | Discharge status: Home / Self Care | Payer: PRIVATE HEALTH INSURANCE | Source: Ambulatory Visit | Attending: Internal Medicine | Admitting: Internal Medicine

## 2018-01-10 DIAGNOSIS — Z87891 Personal history of nicotine dependence: Secondary | ICD-10-CM

## 2018-08-06 ENCOUNTER — Ambulatory Visit: Payer: PRIVATE HEALTH INSURANCE | Admitting: Obstetrics and Gynecology
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
## 2018-08-11 ENCOUNTER — Encounter: Payer: Self-pay | Admitting: Obstetrics and Gynecology

## 2018-08-11 ENCOUNTER — Ambulatory Visit: Payer: PRIVATE HEALTH INSURANCE | Admitting: Obstetrics and Gynecology

## 2018-08-11 VITALS — BP 110/64 | HR 60 | Resp 16 | Ht 67.0 in | Wt 137.6 lb

## 2018-08-11 DIAGNOSIS — Z01419 Encounter for gynecological examination (general) (routine) without abnormal findings: Secondary | ICD-10-CM

## 2018-08-11 MED ORDER — TRIAMCINOLONE ACETONIDE 0.025 % EX OINT: 1.0000 "application " | TOPICAL_OINTMENT | Freq: Two times a day (BID) | CUTANEOUS | 0 refills | Status: DC

## 2018-08-11 NOTE — Patient Instructions (Signed)

## 2018-09-01 ENCOUNTER — Other Ambulatory Visit: Payer: Self-pay | Admitting: Internal Medicine

## 2018-09-01 DIAGNOSIS — Z1231 Encounter for screening mammogram for malignant neoplasm of breast: Secondary | ICD-10-CM

## 2018-09-15 ENCOUNTER — Ambulatory Visit
Admission: RE | Admit: 2018-09-15 | Discharge: 2018-09-15 | Disposition: A | Discharge status: Home / Self Care | Payer: PRIVATE HEALTH INSURANCE | Source: Ambulatory Visit | Attending: Internal Medicine | Admitting: Internal Medicine

## 2018-09-15 DIAGNOSIS — Z1231 Encounter for screening mammogram for malignant neoplasm of breast: Secondary | ICD-10-CM

## 2019-01-28 ENCOUNTER — Other Ambulatory Visit: Payer: Self-pay | Admitting: Internal Medicine

## 2019-01-28 DIAGNOSIS — F17201 Nicotine dependence, unspecified, in remission: Secondary | ICD-10-CM

## 2019-06-09 ENCOUNTER — Ambulatory Visit
Admission: RE | Admit: 2019-06-09 | Discharge: 2019-06-09 | Disposition: A | Discharge status: Home / Self Care | Payer: PRIVATE HEALTH INSURANCE | Source: Ambulatory Visit | Attending: Internal Medicine | Admitting: Internal Medicine

## 2019-06-09 ENCOUNTER — Other Ambulatory Visit: Payer: Self-pay

## 2019-06-09 DIAGNOSIS — F17201 Nicotine dependence, unspecified, in remission: Secondary | ICD-10-CM

## 2019-07-08 ENCOUNTER — Ambulatory Visit: Payer: PRIVATE HEALTH INSURANCE

## 2019-07-08 ENCOUNTER — Ambulatory Visit (INDEPENDENT_AMBULATORY_CARE_PROVIDER_SITE_OTHER): Payer: PRIVATE HEALTH INSURANCE | Admitting: Cardiology

## 2019-07-08 ENCOUNTER — Encounter: Payer: Self-pay | Admitting: Cardiology

## 2019-07-08 ENCOUNTER — Other Ambulatory Visit: Payer: Self-pay

## 2019-07-08 VITALS — BP 131/76 | HR 68 | Temp 98.6°F | Ht 67.0 in | Wt 136.0 lb

## 2019-07-08 DIAGNOSIS — R002 Palpitations: Secondary | ICD-10-CM

## 2019-07-08 DIAGNOSIS — R079 Chest pain, unspecified: Secondary | ICD-10-CM

## 2019-07-08 DIAGNOSIS — Z8774 Personal history of (corrected) congenital malformations of heart and circulatory system: Secondary | ICD-10-CM
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
## 2019-07-10 DIAGNOSIS — R002 Palpitations: Secondary | ICD-10-CM

## 2019-07-20 ENCOUNTER — Other Ambulatory Visit: Payer: Self-pay

## 2019-07-20 ENCOUNTER — Ambulatory Visit (INDEPENDENT_AMBULATORY_CARE_PROVIDER_SITE_OTHER): Payer: PRIVATE HEALTH INSURANCE

## 2019-07-20 DIAGNOSIS — Z8774 Personal history of (corrected) congenital malformations of heart and circulatory system: Secondary | ICD-10-CM

## 2019-07-20 DIAGNOSIS — R079 Chest pain, unspecified: Secondary | ICD-10-CM | POA: Diagnosis not present

## 2019-07-23 NOTE — Progress Notes (Signed)
I discussed these results with the patient. She has stress test on 10/21. Any chance I can see her sooner than 11/18?  Thanks MJP

## 2019-07-23 NOTE — Progress Notes (Signed)
Thanks

## 2019-07-29 ENCOUNTER — Other Ambulatory Visit: Payer: Self-pay

## 2019-07-29 ENCOUNTER — Ambulatory Visit (INDEPENDENT_AMBULATORY_CARE_PROVIDER_SITE_OTHER): Payer: PRIVATE HEALTH INSURANCE

## 2019-07-29 DIAGNOSIS — R079 Chest pain, unspecified: Secondary | ICD-10-CM | POA: Diagnosis not present
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
## 2019-08-05 ENCOUNTER — Other Ambulatory Visit: Payer: Self-pay

## 2019-08-05 ENCOUNTER — Telehealth (INDEPENDENT_AMBULATORY_CARE_PROVIDER_SITE_OTHER): Payer: Self-pay | Admitting: Cardiology

## 2019-08-05 ENCOUNTER — Encounter: Payer: Self-pay | Admitting: Cardiology

## 2019-08-05 VITALS — Ht 67.0 in | Wt 136.0 lb

## 2019-08-05 DIAGNOSIS — R072 Precordial pain: Secondary | ICD-10-CM

## 2019-08-05 DIAGNOSIS — R002 Palpitations: Secondary | ICD-10-CM | POA: Diagnosis not present

## 2019-08-05 DIAGNOSIS — R9439 Abnormal result of other cardiovascular function study: Secondary | ICD-10-CM

## 2019-08-05 DIAGNOSIS — R079 Chest pain, unspecified: Secondary | ICD-10-CM

## 2019-08-05 DIAGNOSIS — I341 Nonrheumatic mitral (valve) prolapse: Secondary | ICD-10-CM | POA: Diagnosis not present

## 2019-08-05 DIAGNOSIS — Z8774 Personal history of (corrected) congenital malformations of heart and circulatory system: Secondary | ICD-10-CM | POA: Diagnosis not present

## 2019-08-05 MED ORDER — NITROGLYCERIN 0.4 MG SL SUBL: 0.4000 mg | SUBLINGUAL_TABLET | SUBLINGUAL | 1 refills | Status: DC | PRN

## 2019-08-05 MED ORDER — METOPROLOL TARTRATE 25 MG PO TABS: 25.0000 mg | ORAL_TABLET | ORAL | 0 refills | Status: DC

## 2019-08-06 NOTE — Telephone Encounter (Signed)
Please read

## 2019-08-06 NOTE — Telephone Encounter (Signed)
Can you find out when the CT scan is?  Thanks MJP

## 2019-08-10 ENCOUNTER — Telehealth: Payer: Self-pay

## 2019-08-17 ENCOUNTER — Other Ambulatory Visit: Payer: Self-pay

## 2019-08-17 DIAGNOSIS — R002 Palpitations: Secondary | ICD-10-CM

## 2019-08-17 DIAGNOSIS — R079 Chest pain, unspecified: Secondary | ICD-10-CM

## 2019-08-17 MED ORDER — METOPROLOL TARTRATE 25 MG PO TABS: 25.0000 mg | ORAL_TABLET | ORAL | 0 refills | Status: DC

## 2019-08-17 MED ORDER — NITROGLYCERIN 0.4 MG SL SUBL: 0.4000 mg | SUBLINGUAL_TABLET | SUBLINGUAL | 1 refills | Status: DC | PRN

## 2019-08-17 NOTE — Telephone Encounter (Signed)
Can you please check with [facility]? I do see that I send it there. Please call them and confirm and let the patient know.  Thanks MJP

## 2019-08-17 NOTE — Telephone Encounter (Signed)
Metoprolol and nitroglyverin. It went to "print" rather than "normal". Please resend. Let me know if it works. Let the patient know when you are able to send.  Thanks MJP

## 2019-08-17 NOTE — Telephone Encounter (Signed)
Ok done and pt was informed

## 2019-08-17 NOTE — Telephone Encounter (Signed)
Thanks MJP  

## 2019-08-17 NOTE — Telephone Encounter (Signed)
Is it only for the Metoprolol. And if it is then no you have not send it over to the pharmacy.

## 2019-08-17 NOTE — Telephone Encounter (Signed)
Forward message per pt

## 2019-08-18 ENCOUNTER — Other Ambulatory Visit: Payer: Self-pay
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
## 2019-08-24 ENCOUNTER — Ambulatory Visit (HOSPITAL_COMMUNITY): Payer: PRIVATE HEALTH INSURANCE

## 2019-08-24 ENCOUNTER — Ambulatory Visit (INDEPENDENT_AMBULATORY_CARE_PROVIDER_SITE_OTHER): Payer: PRIVATE HEALTH INSURANCE | Admitting: Obstetrics and Gynecology

## 2019-08-24 ENCOUNTER — Other Ambulatory Visit: Payer: Self-pay

## 2019-08-24 ENCOUNTER — Encounter: Payer: Self-pay | Admitting: Obstetrics and Gynecology

## 2019-08-24 VITALS — BP 136/76 | HR 70 | Temp 97.2°F | Resp 16 | Ht 67.0 in | Wt 138.4 lb

## 2019-08-24 DIAGNOSIS — Z01419 Encounter for gynecological examination (general) (routine) without abnormal findings: Secondary | ICD-10-CM

## 2019-08-24 NOTE — Patient Instructions (Signed)

## 2019-08-26 ENCOUNTER — Telehealth (HOSPITAL_COMMUNITY): Payer: Self-pay | Admitting: Emergency Medicine

## 2019-08-26 ENCOUNTER — Telehealth: Payer: PRIVATE HEALTH INSURANCE | Admitting: Cardiology

## 2019-08-26 ENCOUNTER — Other Ambulatory Visit: Payer: Self-pay

## 2019-08-26 DIAGNOSIS — Z20822 Contact with and (suspected) exposure to covid-19: Secondary | ICD-10-CM

## 2019-08-26 NOTE — Telephone Encounter (Signed)
Reaching out to patient to offer assistance regarding upcoming cardiac imaging study; pt verbalizes understanding of appt date/time, parking situation and where to check in, pre-test NPO status and medications ordered, and verified current allergies; name and call back number provided for further questions should they arise Meshia Rau RN Navigator Cardiac Imaging  [facility] [phone] office [phone] cell 

## 2019-08-27 ENCOUNTER — Ambulatory Visit (HOSPITAL_COMMUNITY)
Admission: RE | Admit: 2019-08-27 | Discharge: 2019-08-27 | Disposition: A | Discharge status: Home / Self Care | Payer: PRIVATE HEALTH INSURANCE | Source: Ambulatory Visit | Attending: Cardiology | Admitting: Cardiology

## 2019-08-27 ENCOUNTER — Other Ambulatory Visit: Payer: Self-pay

## 2019-08-27 DIAGNOSIS — R072 Precordial pain: Secondary | ICD-10-CM

## 2019-08-27 DIAGNOSIS — R9439 Abnormal result of other cardiovascular function study: Secondary | ICD-10-CM | POA: Diagnosis not present

## 2019-08-27 MED ORDER — NITROGLYCERIN 0.4 MG SL SUBL
SUBLINGUAL_TABLET | SUBLINGUAL | Status: AC
  Filled 2019-08-27: qty 2

## 2019-08-27 MED ORDER — NITROGLYCERIN 0.4 MG SL SUBL
0.8000 mg | SUBLINGUAL_TABLET | Freq: Once | SUBLINGUAL | Status: AC
  Administered 2019-08-27: 0.8 mg via SUBLINGUAL

## 2019-08-27 MED ORDER — IOHEXOL 350 MG/ML SOLN
80.0000 mL | Freq: Once | INTRAVENOUS | Status: AC | PRN
  Administered 2019-08-27: 80 mL via INTRAVENOUS

## 2019-08-27 NOTE — Progress Notes (Signed)
Ct complete. Patient denies any complaints. Offered patient snack and beverage.  

## 2019-08-28 ENCOUNTER — Ambulatory Visit: Payer: Self-pay

## 2019-08-28 DIAGNOSIS — R002 Palpitations: Secondary | ICD-10-CM

## 2019-08-28 LAB — NOVEL CORONAVIRUS, NAA: SARS-CoV-2, NAA: NOT DETECTED

## 2019-09-07 ENCOUNTER — Other Ambulatory Visit: Payer: Self-pay | Admitting: Obstetrics and Gynecology

## 2019-09-07 DIAGNOSIS — Z1231 Encounter for screening mammogram for malignant neoplasm of breast: Secondary | ICD-10-CM

## 2019-09-10 ENCOUNTER — Telehealth: Payer: Self-pay | Admitting: Cardiology

## 2019-09-10 NOTE — Telephone Encounter (Signed)
Reviewed monitor alert. Appears to be artifact with PVC's, not NSVT.

## 2019-09-28 ENCOUNTER — Ambulatory Visit: Payer: PRIVATE HEALTH INSURANCE | Attending: Internal Medicine

## 2019-09-28 ENCOUNTER — Ambulatory Visit
Admission: RE | Admit: 2019-09-28 | Discharge: 2019-09-28 | Disposition: A | Discharge status: Home / Self Care | Payer: PRIVATE HEALTH INSURANCE | Source: Ambulatory Visit

## 2019-09-28 ENCOUNTER — Other Ambulatory Visit: Payer: Self-pay

## 2019-09-28 DIAGNOSIS — Z1231 Encounter for screening mammogram for malignant neoplasm of breast: Secondary | ICD-10-CM

## 2019-09-28 DIAGNOSIS — U071 COVID-19: Secondary | ICD-10-CM

## 2019-09-28 DIAGNOSIS — R238 Other skin changes: Secondary | ICD-10-CM

## 2019-09-29 ENCOUNTER — Other Ambulatory Visit: Payer: Self-pay | Admitting: Obstetrics and Gynecology

## 2019-09-29 DIAGNOSIS — R928 Other abnormal and inconclusive findings on diagnostic imaging of breast: Secondary | ICD-10-CM

## 2019-09-30 ENCOUNTER — Other Ambulatory Visit: Payer: Self-pay

## 2019-09-30 ENCOUNTER — Ambulatory Visit
Admission: RE | Admit: 2019-09-30 | Discharge: 2019-09-30 | Disposition: A | Discharge status: Home / Self Care | Payer: PRIVATE HEALTH INSURANCE | Source: Ambulatory Visit | Attending: Obstetrics and Gynecology | Admitting: Obstetrics and Gynecology

## 2019-09-30 DIAGNOSIS — R928 Other abnormal and inconclusive findings on diagnostic imaging of breast: Secondary | ICD-10-CM

## 2019-09-30 LAB — NOVEL CORONAVIRUS, NAA: SARS-CoV-2, NAA: NOT DETECTED

## 2019-10-14 ENCOUNTER — Telehealth: Payer: Self-pay | Admitting: Cardiology

## 2019-10-14 DIAGNOSIS — I4729 Other ventricular tachycardia: Secondary | ICD-10-CM

## 2019-10-14 DIAGNOSIS — I472 Ventricular tachycardia: Secondary | ICD-10-CM

## 2019-10-14 DIAGNOSIS — I491 Atrial premature depolarization: Secondary | ICD-10-CM

## 2019-10-14 MED ORDER — METOPROLOL TARTRATE 25 MG PO TABS: 12.5000 mg | ORAL_TABLET | ORAL | 3 refills | Status: DC

## 2019-10-14 NOTE — Telephone Encounter (Signed)
Event monitor 08/18/2019 - 09/26/2019: Diagnostic time: 91%  Dominant rhythm: Sinus. HR 41-137 bpm. Avg HR 62 bpm. Episodes of NSVT up to 12 beats noted. Ventricular ectopy 1%. Occasional PAC noted, No atrial fibrillation/atrial flutter/SVT/high grade AV block, sinus pause >3sec noted. Symptoms reported: Tiredness/fatigue-correlates with sinus bradycardia. Flutter/skipped beats correlates with PAC  Discussed these results with the patient. No CAD on CTA. Suspect arrhythmia related to mitral valve prolapse. Started metoprolol tartarate 12.5 mg bid. Echo and f/u in April 2021.

## 2019-11-12 ENCOUNTER — Ambulatory Visit: Payer: PRIVATE HEALTH INSURANCE | Attending: Internal Medicine

## 2019-11-12 DIAGNOSIS — Z20822 Contact with and (suspected) exposure to covid-19: Secondary | ICD-10-CM

## 2019-11-13 LAB — NOVEL CORONAVIRUS, NAA: SARS-CoV-2, NAA: NOT DETECTED

## 2020-01-19 ENCOUNTER — Ambulatory Visit: Payer: Managed Care, Other (non HMO)

## 2020-01-19 ENCOUNTER — Other Ambulatory Visit: Payer: Self-pay

## 2020-01-19 DIAGNOSIS — I341 Nonrheumatic mitral (valve) prolapse: Secondary | ICD-10-CM

## 2020-01-28 ENCOUNTER — Telehealth: Payer: Managed Care, Other (non HMO) | Admitting: Cardiology

## 2020-01-28 ENCOUNTER — Encounter: Payer: Self-pay | Admitting: Cardiology

## 2020-01-28 ENCOUNTER — Ambulatory Visit: Payer: PRIVATE HEALTH INSURANCE | Admitting: Cardiology

## 2020-01-28 VITALS — Ht 67.0 in | Wt 138.0 lb

## 2020-01-28 DIAGNOSIS — I340 Nonrheumatic mitral (valve) insufficiency: Secondary | ICD-10-CM

## 2020-01-28 DIAGNOSIS — I341 Nonrheumatic mitral (valve) prolapse: Secondary | ICD-10-CM

## 2020-01-28 NOTE — Progress Notes (Signed)
Patient referred by Marton Redwood, MD for chest pain  Subjective:   Vanessa Sanchez, female    DOB: May 02, 1961, 59 y.o.   MRN: [id number]  I connected with the patient on 01/28/2020 by a telephone call and verified that I am speaking with the correct person using two identifiers.     I offered the patient a video enabled application for a virtual visit. Unfortunately, this could not be accomplished due to technical difficulties/lack of video enabled phone/computer. I discussed the limitations of evaluation and management by telemedicine and the availability of in person appointments. The patient expressed understanding and agreed to proceed.   This visit type was conducted due to national recommendations for restrictions regarding the COVID-19 Pandemic (e.g. social distancing).  This format is felt to be most appropriate for this patient at this time.  All issues noted in this document were discussed and addressed.  No physical exam was performed (except for noted visual exam findings with Tele health visits).  The patient has consented to conduct a Tele health visit and understands insurance will be billed.   Chief Complaint  Patient presents with  . Mitral Valve Prolapse  . Follow-up  . Results    echo    HPI  59 y.o. Caucasian female with h/o ASD repair (1988), mitral valve prolapse with moderate mitral regurgitation.  Patient is doing well and denies chest pain, shortness of breath, leg edema, orthopnea, PND, TIA/syncope. She has occasional sensation of heart beats, without irregularity, when she rests at night.    Current Outpatient Medications on File Prior to Visit  Medication Sig Dispense Refill  . metoprolol tartrate (LOPRESSOR) 25 MG tablet Take 0.5 tablets (12.5 mg total) by mouth as directed. 30 tablet 3  . nitroGLYCERIN (NITROSTAT) 0.4 MG SL tablet Place 1 tablet (0.4 mg total) under the tongue every 5 (five) minutes as needed for chest pain. 30 tablet 1   No current  facility-administered medications on file prior to visit.    Cardiovascular studies:   Echocardiogram 01/19/2020:  Left ventricle cavity is normal in size and wall thickness. Normal global  wall motion. Normal LV systolic function with EF 55%. Doppler evidence of  grade I (impaired) diastolic dysfunction, normal LAP. Left atrial cavity  is severely dilated. Atrial septal surgical repair appears intact.  Myxomatous degeneration with bileaflet prolapse. Moderate (grade III)  posteriorly directed primary mitral regurgitation.  Mild tricuspid regurgitation. Estimated pulmonary artery systolic pressure  is 30 mmHg.  No significant change compared to previous study on 07/21/2019.  Treadmill Exercise Stress 07/29/2019: The patient exercised for 10:00 min on Bruce protocol; achieved 11.81 METs at 100% of maximum predicted heart rate.  The baseline blood pressure was 120/80 mmHg and increased to 180/70 mmHg at peak exercise. Chest pain is not present. Exercise was terminated due to fatigue/weakness. Resting EKG normal sinus rhythm with 1 mm inferolateral ST depression with T-wave inversion.  Stress EKG positive for myocardial ischemia, 3 mm ST segment depression with T-wave inversion in inferior and lateral leads persisting for >3 minutes into recovery.  Rare PVCs.  Holter monitor 07/08/2019: Dominant rhythm sinus.  Heart rate 43-143 bpm.  Average heart rate 66 bpm. Less than 1% supraventricular ectopy 2.8% ventricular ectopy.  No nonsustained or sustained ventricular tachycardia. No other arrhythmias. No symptoms reported.  EKG 07/08/2019: Sinus rhythm 68 bpm. Nonspecific ST depression  -Nondiagnostic.  No change compared to previous EKG's.  Recent labs: 07/07/2019: D-dimer 0.22 mg/L  09/09/2018: H/H 14/43.  MCV  102.  Platelets 213. Glucose 83.  BUN/creatinine 15/0.8.  EGFR 73.  Sodium 144, potassium 4.6.  Rest of the CMP normal. Cholesterol 237, triglycerides 90, HDL 82, LDL 137. TSH  normal.   Review of Systems  Cardiovascular: Positive for palpitations. Negative for chest pain, dyspnea on exertion, leg swelling and syncope.        No vitals available  Objective:   Physical Exam  Not performed. Telephone visit.      Assessment & Recommendations:   59 y.o. Caucasian female with h/o ASD repair (1988), mitral valve prolapse with moderate mitral regurgitation.  Mitral valve prolapse: Moderate MR, asymptomatic. Monitor for now. Repeat echocardiogram in 6 months  Palpitations: Occasional. Holter without Afib in the past. Monitor for now/  H/o ASD repair: Intact by recent echocardiogram 07/2019.  F/u in 6 months  Mei Suits Esther Hardy, MD [facility] Pager: [phone] Office: [phone] If no answer Cell [phone]

## 2020-01-30 ENCOUNTER — Encounter: Payer: Self-pay | Admitting: Cardiology

## 2020-01-30 DIAGNOSIS — I340 Nonrheumatic mitral (valve) insufficiency: Secondary | ICD-10-CM | POA: Insufficient documentation

## 2020-04-28 ENCOUNTER — Encounter: Payer: Self-pay | Admitting: Internal Medicine

## 2020-05-03 ENCOUNTER — Other Ambulatory Visit: Payer: Self-pay

## 2020-05-03 DIAGNOSIS — I491 Atrial premature depolarization: Secondary | ICD-10-CM

## 2020-05-03 MED ORDER — METOPROLOL TARTRATE 25 MG PO TABS: 12.5000 mg | ORAL_TABLET | ORAL | 3 refills | Status: DC

## 2020-06-14 ENCOUNTER — Telehealth: Payer: Self-pay

## 2020-06-14 NOTE — Telephone Encounter (Signed)
Morning John:  Pt has hx of MVP (07/2019) which is being monitored closely with last transthoracic echo 01/18/20--next study with be after procedure.  She also has hx of NSVT as will as hx of ASD repair.  Please review her chart for any issues that would require further follow up or delay of procedure.  Thanks.

## 2020-06-15 NOTE — Telephone Encounter (Signed)
Terri,  This pt is cleared for anesthetic care at [facility].  Thanks,  Faust Thorington 

## 2020-06-20 ENCOUNTER — Telehealth: Payer: Self-pay

## 2020-06-20 NOTE — Telephone Encounter (Signed)
Opened in error

## 2020-06-21 ENCOUNTER — Encounter: Payer: Self-pay | Admitting: Internal Medicine

## 2020-06-21 ENCOUNTER — Other Ambulatory Visit: Payer: Self-pay

## 2020-06-21 ENCOUNTER — Ambulatory Visit (AMBULATORY_SURGERY_CENTER): Payer: Self-pay

## 2020-06-21 VITALS — Ht 67.0 in | Wt 137.0 lb

## 2020-06-21 DIAGNOSIS — Z1211 Encounter for screening for malignant neoplasm of colon: Secondary | ICD-10-CM

## 2020-06-21 MED ORDER — SUTAB 1479-225-188 MG PO TABS: 1.0000 | ORAL_TABLET | ORAL | 0 refills | Status: DC

## 2020-06-21 NOTE — Progress Notes (Signed)
No egg or soy allergy known to patient  No issues with past sedation with any surgeries or procedures No intubation problems in the past  No FH of Malignant Hyperthermia No diet pills per patient No home 02 use per patient  No blood thinners per patient  Pt denies issues with constipation  No A fib or A flutter  EMMI video via MyChart  COVID 19 guidelines implemented in PV today with Pt and RN  Coupon given to pt in PV today , Code to Pharmacy  COVID vaccines completed on 01/2020 per pt;  Due to the COVID-19 pandemic we are asking patients to follow these guidelines. Please only bring one care partner. Please be aware that your care partner may wait in the car in the parking lot or if they feel like they will be too hot to wait in the car, they may wait in the lobby on the 4th floor. All care partners are required to wear a mask the entire time (we do not have any that we can provide them), they need to practice social distancing, and we will do a Covid check for all patient's and care partners when you arrive. Also we will check their temperature and your temperature. If the care partner waits in their car they need to stay in the parking lot the entire time and we will call them on their cell phone when the patient is ready for discharge so they can bring the car to the front of the building. Also all patient's will need to wear a mask into building.  

## 2020-07-05 ENCOUNTER — Other Ambulatory Visit: Payer: Self-pay

## 2020-07-05 ENCOUNTER — Encounter: Payer: Self-pay | Admitting: Internal Medicine

## 2020-07-05 ENCOUNTER — Ambulatory Visit (AMBULATORY_SURGERY_CENTER): Payer: Managed Care, Other (non HMO) | Admitting: Internal Medicine

## 2020-07-05 VITALS — BP 129/73 | HR 49 | Temp 98.4°F | Resp 15 | Ht 67.0 in | Wt 137.0 lb

## 2020-07-05 DIAGNOSIS — Z1211 Encounter for screening for malignant neoplasm of colon: Secondary | ICD-10-CM | POA: Diagnosis present

## 2020-07-05 MED ORDER — SODIUM CHLORIDE 0.9 % IV SOLN: 500.0000 mL | Freq: Once | INTRAVENOUS | Status: DC

## 2020-07-05 NOTE — Progress Notes (Signed)
Report to PACU, RN, vss, BBS= Clear.  

## 2020-07-05 NOTE — Patient Instructions (Signed)
YOU HAD AN ENDOSCOPIC PROCEDURE TODAY AT THE [facility]:   Refer to the procedure report that was given to you for any specific questions about what was found during the examination.  If the procedure report does not answer your questions, please call your gastroenterologist to clarify.  If you requested that your care partner not be given the details of your procedure findings, then the procedure report has been included in a sealed envelope for you to review at your convenience later.  YOU SHOULD EXPECT: Some feelings of bloating in the abdomen. Passage of more gas than usual.  Walking can help get rid of the air that was put into your GI tract during the procedure and reduce the bloating. If you had a lower endoscopy (such as a colonoscopy or flexible sigmoidoscopy) you may notice spotting of blood in your stool or on the toilet paper. If you underwent a bowel prep for your procedure, you may not have a normal bowel movement for a few days.  Please Note:  You might notice some irritation and congestion in your nose or some drainage.  This is from the oxygen used during your procedure.  There is no need for concern and it should clear up in a day or so.  SYMPTOMS TO REPORT IMMEDIATELY:   Following lower endoscopy (colonoscopy or flexible sigmoidoscopy):  Excessive amounts of blood in the stool  Significant tenderness or worsening of abdominal pains  Swelling of the abdomen that is new, acute  Fever of 100F or higher  For urgent or emergent issues, a gastroenterologist can be reached at any hour by calling [phone]. Do not use MyChart messaging for urgent concerns.    DIET:  We do recommend a small meal at first, but then you may proceed to your regular diet.  Drink plenty of fluids but you should avoid alcoholic beverages for 24 hours.  ACTIVITY:  You should plan to take it easy for the rest of today and you should NOT DRIVE or use heavy machinery until tomorrow (because  of the sedation medicines used during the test).    FOLLOW UP: Our staff will call the number listed on your records 48-72 hours following your procedure to check on you and address any questions or concerns that you may have regarding the information given to you following your procedure. If we do not reach you, we will leave a message.  We will attempt to reach you two times.  During this call, we will ask if you have developed any symptoms of COVID 19. If you develop any symptoms (ie: fever, flu-like symptoms, shortness of breath, cough etc.) before then, please call [phone].  If you test positive for Covid 19 in the 2 weeks post procedure, please call and report this information to us.    If any biopsies were taken you will be contacted by phone or by letter within the next 1-3 weeks.  Please call us at [phone] if you have not heard about the biopsies in 3 weeks.    SIGNATURES/CONFIDENTIALITY: You and/or your care partner have signed paperwork which will be entered into your electronic medical record.  These signatures attest to the fact that that the information above on your After Visit Summary has been reviewed and is understood.  Full responsibility of the confidentiality of this discharge information lies with you and/or your care-partner. 

## 2020-07-05 NOTE — Op Note (Signed)
[facility] Patient Name: Vanessa Sanchez Procedure Date: 07/05/2020 10:38 AM MRN: [id number] Endoscopist: Wilhemina Bonito. Marina Goodell , MD Age: 59 Referring MD:  Date of Birth: 13-Jul-1961 Gender: Female Account #: [id number] Procedure:                Colonoscopy Indications:              Screening for colorectal malignant neoplasm.                            Previous examination June 2011 was negative for                            neoplasia Medicines:                Monitored Anesthesia Care Procedure:                Pre-Anesthesia Assessment:                           - Prior to the procedure, a History and Physical                            was performed, and patient medications and                            allergies were reviewed. The patient's tolerance of                            previous anesthesia was also reviewed. The risks                            and benefits of the procedure and the sedation                            options and risks were discussed with the patient.                            All questions were answered, and informed consent                            was obtained. Prior Anticoagulants: The patient has                            taken no previous anticoagulant or antiplatelet                            agents. ASA Grade Assessment: II - A patient with                            mild systemic disease. After reviewing the risks                            and benefits, the patient was deemed in  satisfactory condition to undergo the procedure.                           After obtaining informed consent, the colonoscope                            was passed under direct vision. Throughout the                            procedure, the patient's blood pressure, pulse, and                            oxygen saturations were monitored continuously. The                            Colonoscope was introduced through the anus and                             advanced to the the cecum, identified by                            appendiceal orifice and ileocecal valve. The                            ileocecal valve, appendiceal orifice, and rectum                            were photographed. The quality of the bowel                            preparation was good. The colonoscopy was performed                            without difficulty. The patient tolerated the                            procedure well. The bowel preparation used was                            SUPREP via split dose instruction. Scope In: 10:54:27 AM Scope Out: 11:15:52 AM Scope Withdrawal Time: 0 hours 9 minutes 11 seconds  Total Procedure Duration: 0 hours 21 minutes 25 seconds  Findings:                 Many small and large-mouthed diverticula were found                            in the entire colon.                           The exam was otherwise without abnormality on                            direct and retroflexion views. Complications:  No immediate complications. Estimated blood loss:                            None. Estimated Blood Loss:     Estimated blood loss: none. Impression:               - Diverticulosis in the entire examined colon.                           - The examination was otherwise normal on direct                            and retroflexion views.                           - No specimens collected. Recommendation:           - Repeat colonoscopy in 10 years for screening                            purposes.                           - Patient has a contact number available for                            emergencies. The signs and symptoms of potential                            delayed complications were discussed with the                            patient. Return to normal activities tomorrow.                            Written discharge instructions were provided to the                            patient.                            - Resume previous diet.                           - Continue present medications. Wilhemina Bonito. Marina Goodell, MD 07/05/2020 11:21:39 AM This report has been signed electronically.

## 2020-07-05 NOTE — Progress Notes (Signed)
Pt's states no medical or surgical changes since previsit or office visit.  SF - vitals 

## 2020-07-07 ENCOUNTER — Telehealth: Payer: Self-pay | Admitting: *Deleted

## 2020-07-07 ENCOUNTER — Telehealth: Payer: Self-pay

## 2020-07-07 NOTE — Telephone Encounter (Signed)
  Follow up Call-  Call back number 07/05/2020  Post procedure Call Back phone  # [phone]  Permission to leave phone message Yes  Some recent data might be hidden     Patient questions:  Do you have a fever, pain , or abdominal swelling? No. Pain Score  0 *  Have you tolerated food without any problems? Yes.    Have you been able to return to your normal activities? Yes.    Do you have any questions about your discharge instructions: Diet   No. Medications  No. Follow up visit  No.  Do you have questions or concerns about your Care? No.  Actions: * If pain score is 4 or above: No action needed, pain <4.  1. Have you developed a fever since your procedure? no  2.   Have you had an respiratory symptoms (SOB or cough) since your procedure? no  3.   Have you tested positive for COVID 19 since your procedure no  4.   Have you had any family members/close contacts diagnosed with the COVID 19 since your procedure?  no   If yes to any of these questions please route to Laverna Peace, RN and Karlton Lemon, RN

## 2020-07-07 NOTE — Telephone Encounter (Signed)
Left message on follow up call. 

## 2020-07-08 ENCOUNTER — Ambulatory Visit: Payer: Managed Care, Other (non HMO)

## 2020-07-08 ENCOUNTER — Other Ambulatory Visit: Payer: Self-pay

## 2020-07-08 DIAGNOSIS — I341 Nonrheumatic mitral (valve) prolapse: Secondary | ICD-10-CM

## 2020-07-11 HISTORY — PX: BLEPHAROPLASTY: SUR158

## 2020-07-23 NOTE — Progress Notes (Signed)
Patient referred by Marton Redwood, MD for chest pain  Subjective:   Vanessa Sanchez, female    DOB: 09/07/1961, 59 y.o.   MRN: [id number]   Chief Complaint  Patient presents with  . Mitral Valve Prolapse  . Follow-up    6 month     HPI  59 y.o. Caucasian female with h/o ASD repair (1988), mitral valve prolapse with moderate mitral regurgitation.  Recent echocardiogram reviewed with the patient, details below. Patient is doing well and denies chest pain, shortness of breath, leg edema, orthopnea, PND, TIA/syncope.    Current Outpatient Medications on File Prior to Visit  Medication Sig Dispense Refill  . metoprolol tartrate (LOPRESSOR) 25 MG tablet Take 0.5 tablets (12.5 mg total) by mouth as directed. 30 tablet 3  . nitroGLYCERIN (NITROSTAT) 0.4 MG SL tablet Place 1 tablet (0.4 mg total) under the tongue every 5 (five) minutes as needed for chest pain. 30 tablet 1  . traMADol (ULTRAM) 50 MG tablet Take 50 mg by mouth daily as needed.     No current facility-administered medications on file prior to visit.    Cardiovascular studies:   EKG 07/29/2020: SInus rhythm 68 bpm Nonspecific ST-T changes  Echocardiogram 07/08/2020:  Normal LV systolic function with visual EF 55-60%. LVIDs 4.3 cm. Left  ventricle cavity is moderately dilated. Normal left ventricular wall  thickness. Normal global wall motion. Doppler evidence of grade II  diastolic dysfunction, elevated LAP.  Left atrial cavity is severely dilated. Atrial septal surgical repair  appears intact.  Native mitral valve. 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  Severe bileaflet mitral valve prolapse and chordae calcification. Mitral  valve leaflet thickening. No evidence of mitral stenosis. Moderate (Grade  III) mitral regurgitation. Jet posteriorly directed.  Moderate tricuspid regurgitation. RVSP measures 31 mmHg.  Mild pulmonic regurgitation.  IVC is dilated with a respiratory response of >50%.  No  significant change compared to previous study on 01/19/2020.  Treadmill Exercise Stress 07/29/2019: The patient exercised for 10:00 min on Bruce protocol; achieved 11.81 METs at 100% of maximum predicted heart rate.  The baseline blood pressure was 120/80 mmHg and increased to 180/70 mmHg at peak exercise. Chest pain is not present. Exercise was terminated due to fatigue/weakness. Resting EKG normal sinus rhythm with 1 mm inferolateral ST depression with T-wave inversion.  Stress EKG positive for myocardial ischemia, 3 mm ST segment depression with T-wave inversion in inferior and lateral leads persisting for >3 minutes into recovery.  Rare PVCs.  Holter monitor 07/08/2019: Dominant rhythm sinus.  Heart rate 43-143 bpm.  Average heart rate 66 bpm. Less than 1% supraventricular ectopy 2.8% ventricular ectopy.  No nonsustained or sustained ventricular tachycardia. No other arrhythmias. No symptoms reported.  Recent labs: 07/07/2019: D-dimer 0.22 mg/L  09/09/2018: H/H 14/43.  MCV 102.  Platelets 213. Glucose 83.  BUN/creatinine 15/0.8.  EGFR 73.  Sodium 144, potassium 4.6.  Rest of the CMP normal. Cholesterol 237, triglycerides 90, HDL 82, LDL 137. TSH normal.   Review of Systems  Cardiovascular: Positive for palpitations. Negative for chest pain, dyspnea on exertion, leg swelling and syncope.        Vitals:   07/29/20 1328  BP: (!) 119/54  Pulse: 74  Resp: 16  SpO2: 98%     Objective:   Physical Exam Vitals and nursing note reviewed.  Constitutional:      General: She is not in acute distress. Neck:     Vascular: No JVD.  Cardiovascular:  Rate and Rhythm: Normal rate and regular rhythm.     Heart sounds: Normal heart sounds. No murmur heard.   Pulmonary:     Effort: Pulmonary effort is normal.     Breath sounds: Normal breath sounds. No wheezing or rales.         Assessment & Recommendations:   59 y.o. Caucasian female with h/o ASD repair (1988), mitral valve  prolapse with moderate mitral regurgitation.   Mitral valve prolapse: Moderate MR, asymptomatic. Monitor for now. Repeat echocardiogram in 1 year  Palpitations: Occasional. Holter without Afib in the past. Monitor for now   H/o ASD repair: Intact by recent echocardiogram 07/2019.  F/u in 1 year  Nigel Mormon, MD [facility] Pager: [phone] Office: [phone] If no answer Cell [phone]

## 2020-07-29 ENCOUNTER — Other Ambulatory Visit: Payer: Self-pay

## 2020-07-29 ENCOUNTER — Ambulatory Visit: Payer: Managed Care, Other (non HMO) | Admitting: Cardiology

## 2020-07-29 ENCOUNTER — Encounter: Payer: Self-pay | Admitting: Cardiology

## 2020-07-29 VITALS — BP 119/54 | HR 74 | Resp 16 | Ht 67.0 in | Wt 137.0 lb

## 2020-07-29 DIAGNOSIS — I340 Nonrheumatic mitral (valve) insufficiency: Secondary | ICD-10-CM

## 2020-07-29 DIAGNOSIS — I341 Nonrheumatic mitral (valve) prolapse: Secondary | ICD-10-CM
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
## 2020-08-24 ENCOUNTER — Other Ambulatory Visit: Payer: Self-pay

## 2020-08-24 ENCOUNTER — Ambulatory Visit: Payer: Managed Care, Other (non HMO) | Admitting: Obstetrics and Gynecology

## 2020-08-24 ENCOUNTER — Encounter: Payer: Self-pay | Admitting: Obstetrics and Gynecology

## 2020-08-24 VITALS — BP 116/66 | HR 59 | Ht 66.5 in | Wt 138.6 lb

## 2020-08-24 DIAGNOSIS — Z01419 Encounter for gynecological examination (general) (routine) without abnormal findings: Secondary | ICD-10-CM | POA: Diagnosis not present

## 2020-08-24 NOTE — Patient Instructions (Signed)

## 2020-09-08 ENCOUNTER — Other Ambulatory Visit: Payer: Self-pay | Admitting: Obstetrics and Gynecology

## 2020-09-08 DIAGNOSIS — Z1231 Encounter for screening mammogram for malignant neoplasm of breast: Secondary | ICD-10-CM

## 2020-10-11 ENCOUNTER — Other Ambulatory Visit: Payer: Self-pay | Admitting: Internal Medicine

## 2020-10-11 DIAGNOSIS — F17201 Nicotine dependence, unspecified, in remission: Secondary | ICD-10-CM

## 2020-10-25 ENCOUNTER — Ambulatory Visit
Admission: RE | Admit: 2020-10-25 | Discharge: 2020-10-25 | Disposition: A | Discharge status: Home / Self Care | Payer: Managed Care, Other (non HMO) | Source: Ambulatory Visit | Attending: Obstetrics and Gynecology | Admitting: Obstetrics and Gynecology

## 2020-10-25 ENCOUNTER — Ambulatory Visit: Payer: Managed Care, Other (non HMO)

## 2020-10-25 ENCOUNTER — Other Ambulatory Visit: Payer: Self-pay

## 2020-10-25 DIAGNOSIS — Z1231 Encounter for screening mammogram for malignant neoplasm of breast: Secondary | ICD-10-CM

## 2020-10-27 ENCOUNTER — Ambulatory Visit: Payer: Managed Care, Other (non HMO)

## 2020-11-24 ENCOUNTER — Ambulatory Visit: Payer: Managed Care, Other (non HMO)

## 2020-11-30 ENCOUNTER — Ambulatory Visit: Payer: Managed Care, Other (non HMO)

## 2020-12-05 ENCOUNTER — Ambulatory Visit
Admission: RE | Admit: 2020-12-05 | Discharge: 2020-12-05 | Disposition: A | Discharge status: Home / Self Care | Payer: Managed Care, Other (non HMO) | Source: Ambulatory Visit | Attending: Internal Medicine | Admitting: Internal Medicine

## 2020-12-05 ENCOUNTER — Other Ambulatory Visit: Payer: Self-pay

## 2020-12-05 DIAGNOSIS — F17201 Nicotine dependence, unspecified, in remission: Secondary | ICD-10-CM

## 2021-07-18 ENCOUNTER — Other Ambulatory Visit: Payer: Self-pay

## 2021-07-18 ENCOUNTER — Telehealth: Payer: Self-pay

## 2021-07-18 ENCOUNTER — Ambulatory Visit: Payer: Managed Care, Other (non HMO)

## 2021-07-18 DIAGNOSIS — I341 Nonrheumatic mitral (valve) prolapse: Secondary | ICD-10-CM

## 2021-07-18 DIAGNOSIS — I340 Nonrheumatic mitral (valve) insufficiency: Secondary | ICD-10-CM

## 2021-07-18 NOTE — Telephone Encounter (Signed)
If patient is still in the office, please get an EKG.  Thanks MJP

## 2021-07-18 NOTE — Telephone Encounter (Signed)
Can make sooner appt, if continues to have tachycardia.  Thanks MJP

## 2021-07-18 NOTE — Telephone Encounter (Signed)
Pt is not longer here that happen this morning

## 2021-07-19 NOTE — Telephone Encounter (Signed)
Spoke with patient, she will wait a few days and if she feels worse, she will call back.

## 2021-07-20 ENCOUNTER — Encounter: Payer: Self-pay | Admitting: Cardiology

## 2021-07-20 ENCOUNTER — Ambulatory Visit: Payer: Managed Care, Other (non HMO) | Admitting: Cardiology

## 2021-07-20 ENCOUNTER — Other Ambulatory Visit: Payer: Self-pay

## 2021-07-20 VITALS — BP 95/79 | HR 98 | Temp 97.9°F | Resp 17 | Ht 66.5 in | Wt 138.6 lb

## 2021-07-20 DIAGNOSIS — I480 Paroxysmal atrial fibrillation: Secondary | ICD-10-CM

## 2021-07-20 DIAGNOSIS — I340 Nonrheumatic mitral (valve) insufficiency: Secondary | ICD-10-CM

## 2021-07-20 DIAGNOSIS — I361 Nonrheumatic tricuspid (valve) insufficiency: Secondary | ICD-10-CM | POA: Insufficient documentation

## 2021-07-20 DIAGNOSIS — I341 Nonrheumatic mitral (valve) prolapse: Secondary | ICD-10-CM

## 2021-07-20 MED ORDER — APIXABAN 5 MG PO TABS: 5.0000 mg | ORAL_TABLET | Freq: Two times a day (BID) | ORAL | 2 refills | Status: DC

## 2021-07-20 MED ORDER — DILTIAZEM HCL ER COATED BEADS 120 MG PO CP24: 120.0000 mg | ORAL_CAPSULE | Freq: Every day | ORAL | 3 refills | Status: DC

## 2021-07-20 NOTE — Progress Notes (Signed)
Patient referred by Ginger Organ., MD for chest pain  Subjective:   Vanessa Sanchez, female    DOB: 31-Jan-1961, 60 y.o.   MRN: [id number]   Chief Complaint  Patient presents with   Mitral Valve Prolapse   Follow-up    HPI  60 y.o. Caucasian female with h/o ASD repair (1988), mitral valve prolapse with moderate to severe mitral regurgitation, moderate mitral regurgitation, now with paroxysmal A. Fib  Patient was last seen by me in 07/2020.  At that time, she was asymptomatic from a mitral regurgitation.  She underwent echocardiogram last week, details below.  She was noted to be tachycardic during the echocardiogram.  Today's appointment was made to evaluate her for any tachyarrhythmia, particularly atrial fibrillation.  Today, patient tells me that she has been feeling short of breath with minimal activity, and generalized lack of energy.  Discussed echocardiogram results as well as EKG results in details, see below.   Current Outpatient Medications on File Prior to Visit  Medication Sig Dispense Refill   metoprolol tartrate (LOPRESSOR) 25 MG tablet Take 0.5 tablets (12.5 mg total) by mouth as directed. 30 tablet 3   No current facility-administered medications on file prior to visit.    Cardiovascular studies:   EKG 07/20/2021: Atrial fibrillation 123 bpm  Echocardiogram 07/20/2021:  Left ventricle cavity is normal in size and wall thickness. Normal global  wall motion. Normal LV systolic function with EF 55%. Indeterminate  diastolic filling pattern.  Left atrial cavity is severely dilated.  Moderate prolapse of the mitral valve leaflets. Moderate to severe mitral  regurgitation.  Moderate tricuspid regurgitation. Estimated pulmonary artery systolic  pressure 38 mmHg.  Compared to previous study in 2021, tachycardia is new. Estimated PASP is  increased from 31 mmHg to 38 mmHg.   Treadmill Exercise Stress 07/29/2019: The patient exercised for 10:00 min on Bruce  protocol; achieved 11.81 METs at 100% of maximum predicted heart rate.  The baseline blood pressure was 120/80 mmHg and increased to 180/70 mmHg at peak exercise. Chest pain is not present. Exercise was terminated due to fatigue/weakness. Resting EKG normal sinus rhythm with 1 mm inferolateral ST depression with T-wave inversion.  Stress EKG positive for myocardial ischemia, 3 mm ST segment depression with T-wave inversion in inferior and lateral leads persisting for >3 minutes into recovery.  Rare PVCs.  Holter monitor 07/08/2019: Dominant rhythm sinus.  Heart rate 43-143 bpm.  Average heart rate 66 bpm. Less than 1% supraventricular ectopy 2.8% ventricular ectopy.  No nonsustained or sustained ventricular tachycardia. No other arrhythmias. No symptoms reported.  Recent labs: 07/07/2019: D-dimer 0.22 mg/L  09/09/2018: H/H 14/43.  MCV 102.  Platelets 213. Glucose 83.  BUN/creatinine 15/0.8.  EGFR 73.  Sodium 144, potassium 4.6.  Rest of the CMP normal. Cholesterol 237, triglycerides 90, HDL 82, LDL 137. TSH normal.   Review of Systems  Cardiovascular:  Positive for palpitations. Negative for chest pain, dyspnea on exertion, leg swelling and syncope.       Vitals:   07/20/21 1203 07/20/21 1204  BP:    Pulse:    Resp:    Temp:    SpO2: 98% 100%   Orthostatic VS for the past 72 hrs (Last 3 readings):  Orthostatic BP Patient Position BP Location Cuff Size Orthostatic Pulse  07/20/21 1204 119/90 Standing Left Arm Normal 119  07/20/21 1203 116/66 Sitting Left Arm Normal 93  07/20/21 1202 134/86 Supine Left Arm Normal 127  Objective:   Physical Exam Vitals and nursing note reviewed.  Constitutional:      General: She is not in acute distress. Neck:     Vascular: No JVD.  Cardiovascular:     Rate and Rhythm: Normal rate and regular rhythm.     Heart sounds: Normal heart sounds. No murmur heard. Pulmonary:     Effort: Pulmonary effort is normal.     Breath sounds:  Normal breath sounds. No wheezing or rales.        Assessment & Recommendations:    60 y.o. Caucasian female with h/o ASD repair (1988), mitral valve prolapse with moderate to severe mitral regurgitation, moderate mitral regurgitation, now with paroxysmal A. Fib  PAF: New onset, within last 1 week Currently RVR in 120s Started diltiazem 120 mg a day. Given that she is symptomatic, recommend TEE guided cardioversion. While CHA2DS2VAsc score is 1, with annual stroke risk of 0.6%, given plans for TEE guided cardioversion, recommend Eliquis 5 mg twice daily.  Mitral valve prolapse: Moderate to severe MR, with severe left atrial dilation and new onset of A. fib.  Estimated PASP 38 mmHg on transthoracic echocardiogram.   I recommend consideration for mitral valve repair given the above findings.   Will refer her to [facility].   If decision is made to proceed with mitral valve repair, I will be happy to perform coronary angiography prior to mitral valve repair.   I will send CD of her TEE with her for her evaluation at [facility].    H/o ASD repair: Intact by recent echocardiogram 07/2019.  F/u in 2 weeks   Nigel Mormon, MD [facility] Pager: [phone] Office: [phone] If no answer Cell [phone]

## 2021-07-21 LAB — BASIC METABOLIC PANEL
BUN/Creatinine Ratio: 21 (ref 12–28)
BUN: 17 mg/dL (ref 8–27)
CO2: 23 mmol/L (ref 20–29)
Calcium: 9.8 mg/dL (ref 8.7–10.3)
Chloride: 106 mmol/L (ref 96–106)
Creatinine, Ser: 0.8 mg/dL (ref 0.57–1.00)
Glucose: 97 mg/dL (ref 70–99)
Potassium: 4.5 mmol/L (ref 3.5–5.2)
Sodium: 144 mmol/L (ref 134–144)
eGFR: 84 mL/min/{1.73_m2} (ref >59)

## 2021-07-21 NOTE — Telephone Encounter (Signed)
From pt

## 2021-07-21 NOTE — Telephone Encounter (Signed)
Spoke to the patient and answered all questions. TEE cardioversion with me on 10/27. As scheduled or with Dr Jacinto Halim on 10/24, subject to endoscopy suite availability. Patient will call back if she would prefer [address]. In that case, will need to schedule with endoscopy.

## 2021-07-24 NOTE — Telephone Encounter (Signed)
From pt

## 2021-07-25 ENCOUNTER — Encounter (HOSPITAL_COMMUNITY): Payer: Self-pay | Admitting: Cardiology

## 2021-07-25 NOTE — Telephone Encounter (Signed)
From pt

## 2021-07-27 NOTE — Telephone Encounter (Signed)
Spoke with the patient, answered all question.  Elder Negus, MD

## 2021-07-31 ENCOUNTER — Ambulatory Visit: Payer: Managed Care, Other (non HMO) | Admitting: Cardiology

## 2021-07-31 ENCOUNTER — Other Ambulatory Visit: Payer: Self-pay | Admitting: Cardiology

## 2021-08-03 ENCOUNTER — Ambulatory Visit (HOSPITAL_COMMUNITY): Payer: Managed Care, Other (non HMO) | Admitting: Anesthesiology

## 2021-08-03 ENCOUNTER — Ambulatory Visit (HOSPITAL_COMMUNITY)
Admission: RE | Admit: 2021-08-03 | Discharge: 2021-08-03 | Disposition: A | Discharge status: Home / Self Care | Payer: Managed Care, Other (non HMO) | Attending: Cardiology | Admitting: Cardiology

## 2021-08-03 ENCOUNTER — Encounter (HOSPITAL_COMMUNITY)
Admission: RE | Disposition: A | Discharge status: Home / Self Care | Payer: Self-pay | Source: Home / Self Care | Attending: Cardiology

## 2021-08-03 ENCOUNTER — Ambulatory Visit (HOSPITAL_COMMUNITY)
Admission: RE | Admit: 2021-08-03 | Discharge: 2021-08-03 | Disposition: A | Discharge status: Home / Self Care | Payer: Managed Care, Other (non HMO) | Source: Home / Self Care | Attending: Cardiology | Admitting: Cardiology

## 2021-08-03 ENCOUNTER — Encounter (HOSPITAL_COMMUNITY): Payer: Self-pay | Admitting: Cardiology

## 2021-08-03 ENCOUNTER — Telehealth: Payer: Self-pay | Admitting: Cardiology

## 2021-08-03 DIAGNOSIS — I361 Nonrheumatic tricuspid (valve) insufficiency: Secondary | ICD-10-CM

## 2021-08-03 DIAGNOSIS — I081 Rheumatic disorders of both mitral and tricuspid valves: Secondary | ICD-10-CM | POA: Insufficient documentation

## 2021-08-03 DIAGNOSIS — I340 Nonrheumatic mitral (valve) insufficiency: Secondary | ICD-10-CM

## 2021-08-03 DIAGNOSIS — I480 Paroxysmal atrial fibrillation: Secondary | ICD-10-CM | POA: Diagnosis not present

## 2021-08-03 DIAGNOSIS — I341 Nonrheumatic mitral (valve) prolapse: Secondary | ICD-10-CM | POA: Diagnosis present

## 2021-08-03 HISTORY — PX: CARDIOVERSION: SHX1299

## 2021-08-03 HISTORY — PX: TEE WITHOUT CARDIOVERSION: SHX5443

## 2021-08-03 LAB — ECHO TEE
MV M vel: 4.6 m/s
MV Peak grad: 84.5 mmHg
Radius: 0.65 cm

## 2021-08-03 SURGERY — ECHOCARDIOGRAM, TRANSESOPHAGEAL
Anesthesia: Monitor Anesthesia Care

## 2021-08-03 MED ORDER — LIDOCAINE 2% (20 MG/ML) 5 ML SYRINGE
INTRAMUSCULAR | Status: DC | PRN
  Administered 2021-08-03: 60 mg via INTRAVENOUS

## 2021-08-03 MED ORDER — PROPOFOL 500 MG/50ML IV EMUL
INTRAVENOUS | Status: DC | PRN
  Administered 2021-08-03: 125 ug/kg/min via INTRAVENOUS

## 2021-08-03 MED ORDER — PHENYLEPHRINE 40 MCG/ML (10ML) SYRINGE FOR IV PUSH (FOR BLOOD PRESSURE SUPPORT)
PREFILLED_SYRINGE | INTRAVENOUS | Status: DC | PRN
  Administered 2021-08-03: 80 ug via INTRAVENOUS

## 2021-08-03 MED ORDER — PROPOFOL 10 MG/ML IV BOLUS
INTRAVENOUS | Status: DC | PRN
  Administered 2021-08-03: 25 mg via INTRAVENOUS
  Administered 2021-08-03: 20 mg via INTRAVENOUS
  Administered 2021-08-03: 25 mg via INTRAVENOUS

## 2021-08-03 MED ORDER — SODIUM CHLORIDE 0.9 % IV SOLN: INTRAVENOUS | Status: DC

## 2021-08-03 NOTE — CV Procedure (Signed)
TEE: Under moderate sedation, TEE was performed without complications: LV: Normal. Normal EF. RV: Normal LA: Normal. Left atrial appendage: Normal without thrombus. Normal function. Inter atrial septum is intact without defect.  RA: Normal  MV: P2 prolapse with two separate MR jets, both moderate mid to late systolic TV: Mod to severe TR AV: Normal. No AI or AS. PV: Normal. Trace PI.  Thoracic and ascending aorta: Normal without significant plaque or atheromatous changes.  Deep sedation provided and monitored by anesthesiology.   Direct current cardioversion:  Indication symptomatic: Symptomatic atrial fibrillation  Procedure: Under deep sedation administered and monitored by anesthesiology, synchronized direct current cardioversion performed. Patient was delivered with 120 Joules of electricity X 1 with success to NSR. Patient tolerated the procedure well. No immediate complication noted.   Elder Negus, MD [facility] [place] Pager: [phone] Office: [phone] If no answer Cell [phone]

## 2021-08-03 NOTE — Anesthesia Procedure Notes (Addendum)
Procedure Name: General with mask airway Date/Time: 08/03/2021 7:48 AM Performed by: Aundria Rud, CRNA Pre-anesthesia Checklist: Patient identified, Emergency Drugs available, Suction available and Patient being monitored Patient Re-evaluated:Patient Re-evaluated prior to induction Oxygen Delivery Method: Nasal cannula Preoxygenation: Pre-oxygenation with 100% oxygen Induction Type: IV induction Airway Equipment and Method: Bite block Placement Confirmation: positive ETCO2 and CO2 detector Dental Injury: Teeth and Oropharynx as per pre-operative assessment

## 2021-08-03 NOTE — Transfer of Care (Signed)
Immediate Anesthesia Transfer of Care Note  Patient: Vanessa Sanchez  Procedure(s) Performed: TRANSESOPHAGEAL ECHOCARDIOGRAM (TEE) CARDIOVERSION  Patient Location: PACU  Anesthesia Type:MAC  Level of Consciousness: awake, alert  and oriented  Airway & Oxygen Therapy: Patient Spontanous Breathing and Patient connected to nasal cannula oxygen  Post-op Assessment: Report given to RN and Post -op Vital signs reviewed and stable  Post vital signs: Reviewed and stable  Last Vitals:  Vitals Value Taken Time  BP 101/63 08/03/21 0832  Temp    Pulse 54 08/03/21 0833  Resp 15 08/03/21 0833  SpO2 95 % 08/03/21 0833  Vitals shown include unvalidated device data.  Last Pain:  Vitals:   08/03/21 0710  TempSrc: Temporal  PainSc: 0-No pain         Complications: No notable events documented.

## 2021-08-03 NOTE — Anesthesia Postprocedure Evaluation (Signed)
Anesthesia Post Note  Patient: Vanessa Sanchez  Procedure(s) Performed: TRANSESOPHAGEAL ECHOCARDIOGRAM (TEE) CARDIOVERSION     Patient location during evaluation: PACU Anesthesia Type: MAC Level of consciousness: awake and alert Pain management: pain level controlled Vital Signs Assessment: post-procedure vital signs reviewed and stable Respiratory status: spontaneous breathing, nonlabored ventilation, respiratory function stable and patient connected to nasal cannula oxygen Cardiovascular status: stable and blood pressure returned to baseline Postop Assessment: no apparent nausea or vomiting Anesthetic complications: no   No notable events documented.  Last Vitals:  Vitals:   08/03/21 0832 08/03/21 0842  BP: 101/63 99/65  Pulse: (!) 53 (!) 50  Resp: 12 17  Temp: (!) 36.2 C (!) 36.2 C  SpO2: 96% 98%    Last Pain:  Vitals:   08/03/21 0842  TempSrc:   PainSc: 0-No pain                 Byrant Valent L Mansel Strother

## 2021-08-03 NOTE — Anesthesia Preprocedure Evaluation (Addendum)
Anesthesia Evaluation  Patient identified by MRN, date of birth, ID band Patient awake    Reviewed: Allergy & Precautions, NPO status , Patient's Chart, lab work & pertinent test results, reviewed documented beta blocker date and time   Airway Mallampati: I  TM Distance: >3 FB Neck ROM: Full    Dental no notable dental hx. (+) Teeth Intact, Dental Advisory Given   Pulmonary neg pulmonary ROS, former smoker,    Pulmonary exam normal breath sounds clear to auscultation       Cardiovascular Normal cardiovascular exam+ dysrhythmias Atrial Fibrillation and Supra Ventricular Tachycardia + Valvular Problems/Murmurs (MVP, h/o ASD repair)  Rhythm:Regular Rate:Normal     Neuro/Psych negative neurological ROS  negative psych ROS   GI/Hepatic negative GI ROS, Neg liver ROS,   Endo/Other  negative endocrine ROS  Renal/GU negative Renal ROS  negative genitourinary   Musculoskeletal negative musculoskeletal ROS (+)   Abdominal   Peds  Hematology  (+) Blood dyscrasia (on eliquis), ,   Anesthesia Other Findings   Reproductive/Obstetrics                            Anesthesia Physical Anesthesia Plan  ASA: 2  Anesthesia Plan: MAC   Post-op Pain Management:    Induction: Intravenous  PONV Risk Score and Plan: 2 and Propofol infusion and Treatment may vary due to age or medical condition  Airway Management Planned: Natural Airway  Additional Equipment:   Intra-op Plan:   Post-operative Plan:   Informed Consent: I have reviewed the patients History and Physical, chart, labs and discussed the procedure including the risks, benefits and alternatives for the proposed anesthesia with the patient or authorized representative who has indicated his/her understanding and acceptance.     Dental advisory given  Plan Discussed with: CRNA  Anesthesia Plan Comments:         Anesthesia Quick  Evaluation

## 2021-08-03 NOTE — Progress Notes (Signed)
  Echocardiogram Echocardiogram Transesophageal has been performed.  Delcie Roch 08/03/2021, 8:49 AM

## 2021-08-03 NOTE — Interval H&P Note (Signed)
History and Physical Interval Note:  08/03/2021 7:40 AM  Vanessa Sanchez  has presented today for surgery, with the diagnosis of MITRO VALVE PROLAPSE.  The various methods of treatment have been discussed with the patient and family. After consideration of risks, benefits and other options for treatment, the patient has consented to  Procedure(s): TRANSESOPHAGEAL ECHOCARDIOGRAM (TEE) (N/A) CARDIOVERSION (N/A) as a surgical intervention.  The patient's history has been reviewed, patient examined, no change in status, stable for surgery.  I have reviewed the patient's chart and labs.  Questions were answered to the patient's satisfaction.     Laronda Lisby J Brannon Levene

## 2021-08-03 NOTE — H&P (Signed)
OV 07/20/2021 copied for documentation     Patient referred by Vanessa Sanchez., MD for chest pain  Subjective:   Vanessa Sanchez, female    DOB: Jul 16, 1961, 60 y.o.   MRN: [id number]   Chief Complaint  Patient presents with   Mitral Valve Prolapse   Follow-up    HPI  60 y.o. Caucasian female with h/o ASD repair (1988), mitral valve prolapse with moderate to severe mitral regurgitation, moderate mitral regurgitation, now with paroxysmal A. Fib  Patient was last seen by me in 07/2020.  At that time, she was asymptomatic from a mitral regurgitation.  She underwent echocardiogram last week, details below.  She was noted to be tachycardic during the echocardiogram.  Today's appointment was made to evaluate her for any tachyarrhythmia, particularly atrial fibrillation.  Today, patient tells me that she has been feeling short of breath with minimal activity, and generalized lack of energy.  Discussed echocardiogram results as well as EKG results in details, see below.   Current Outpatient Medications on File Prior to Visit  Medication Sig Dispense Refill   metoprolol tartrate (LOPRESSOR) 25 MG tablet Take 0.5 tablets (12.5 mg total) by mouth as directed. 30 tablet 3   No current facility-administered medications on file prior to visit.    Cardiovascular studies:   EKG 07/20/2021: Atrial fibrillation 123 bpm  Echocardiogram 07/20/2021:  Left ventricle cavity is normal in size and wall thickness. Normal global  wall motion. Normal LV systolic function with EF 55%. Indeterminate  diastolic filling pattern.  Left atrial cavity is severely dilated.  Moderate prolapse of the mitral valve leaflets. Moderate to severe mitral  regurgitation.  Moderate tricuspid regurgitation. Estimated pulmonary artery systolic  pressure 38 mmHg.  Compared to previous study in 2021, tachycardia is new. Estimated PASP is  increased from 31 mmHg to 38 mmHg.   Treadmill Exercise Stress 07/29/2019: The  patient exercised for 10:00 min on Bruce protocol; achieved 11.81 METs at 100% of maximum predicted heart rate.  The baseline blood pressure was 120/80 mmHg and increased to 180/70 mmHg at peak exercise. Chest pain is not present. Exercise was terminated due to fatigue/weakness. Resting EKG normal sinus rhythm with 1 mm inferolateral ST depression with T-wave inversion.  Stress EKG positive for myocardial ischemia, 3 mm ST segment depression with T-wave inversion in inferior and lateral leads persisting for >3 minutes into recovery.  Rare PVCs.  Holter monitor 07/08/2019: Dominant rhythm sinus.  Heart rate 43-143 bpm.  Average heart rate 66 bpm. Less than 1% supraventricular ectopy 2.8% ventricular ectopy.  No nonsustained or sustained ventricular tachycardia. No other arrhythmias. No symptoms reported.  Recent labs: 07/07/2019: D-dimer 0.22 mg/L  09/09/2018: H/H 14/43.  MCV 102.  Platelets 213. Glucose 83.  BUN/creatinine 15/0.8.  EGFR 73.  Sodium 144, potassium 4.6.  Rest of the CMP normal. Cholesterol 237, triglycerides 90, HDL 82, LDL 137. TSH normal.   Review of Systems  Cardiovascular:  Positive for palpitations. Negative for chest pain, dyspnea on exertion, leg swelling and syncope.       Vitals:   07/20/21 1203 07/20/21 1204  BP:    Pulse:    Resp:    Temp:    SpO2: 98% 100%   Orthostatic VS for the past 72 hrs (Last 3 readings):  Orthostatic BP Patient Position BP Location Cuff Size Orthostatic Pulse  07/20/21 1204 119/90 Standing Left Arm Normal 119  07/20/21 1203 116/66 Sitting Left Arm Normal 93  07/20/21 1202 134/86 Supine Left Arm  Normal 127      Objective:   Physical Exam Vitals and nursing note reviewed.  Constitutional:      General: She is not in acute distress. Neck:     Vascular: No JVD.  Cardiovascular:     Rate and Rhythm: Normal rate and regular rhythm.     Heart sounds: Normal heart sounds. No murmur heard. Pulmonary:     Effort: Pulmonary  effort is normal.     Breath sounds: Normal breath sounds. No wheezing or rales.        Assessment & Recommendations:    60 y.o. Caucasian female with h/o ASD repair (1988), mitral valve prolapse with moderate to severe mitral regurgitation, moderate mitral regurgitation, now with paroxysmal A. Fib  PAF: New onset, within last 1 week Currently RVR in 120s Started diltiazem 120 mg a day. Given that she is symptomatic, recommend TEE guided cardioversion. While CHA2DS2VAsc score is 1, with annual stroke risk of 0.6%, given plans for TEE guided cardioversion, recommend Eliquis 5 mg twice daily.  Mitral valve prolapse: Moderate to severe MR, with severe left atrial dilation and new onset of A. fib.  Estimated PASP 38 mmHg on transthoracic echocardiogram.   I recommend consideration for mitral valve repair given the above findings.   Will refer her to [facility].   If decision is made to proceed with mitral valve repair, I will be happy to perform coronary angiography prior to mitral valve repair.   I will send CD of her TEE with her for her evaluation at [facility].    H/o ASD repair: Intact by recent echocardiogram 07/2019.  F/u in 2 weeks   Nigel Mormon, MD [facility] [place] Pager: [phone] Office: [phone] If no answer Cell [phone]

## 2021-08-03 NOTE — Telephone Encounter (Signed)
Patient underwent successful TEE guided cardioversion with conversion to sinus rhythm.  TEE 08/03/2021:  1. Left ventricular ejection fraction, by estimation, is 55 to 60%. The  left ventricle has normal function.   2. Right ventricular systolic function is normal. The right ventricular  size is normal.   3. Spontaneous echo contrast seen in left atrium without LA/LAA thrombus.   4. 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 Two separate MR  jets, both moderate in severity.   5. Tricuspid valve regurgitation is moderate to severe.   6. The aortic valve is normal in structure. Aortic valve regurgitation is  not visualized. No aortic stenosis is present.   7. The left upper pulmonary vein, right upper pulmonary vein and right  lower pulmonary vein are abnormal showing systolic blunting.   I discussed the above findings with the patient over the phone.  I have historically followed patient's mitral valve prolapse over the last 3 years.  Severity of her mitral valve prolapse was less than I anticipated to see.  Nonetheless, given her severe left atrial dilatation and now presence of atrial fibrillation, I think it still worth getting a consult from Dr. Ty Hilts regarding potential surgical management of her mitral and tricuspid valve.  In the meantime, I have recommended her to continue metoprolol, diltiazem, and Eliquis.  Patient will carry CD of her TEE images with her to Dr. Maisie Fus office.   Elder Negus, MD Pager: [phone] Office: [phone]

## 2021-08-04 ENCOUNTER — Other Ambulatory Visit: Payer: Self-pay | Admitting: Obstetrics and Gynecology

## 2021-08-04 DIAGNOSIS — Z1231 Encounter for screening mammogram for malignant neoplasm of breast: Secondary | ICD-10-CM

## 2021-08-07 ENCOUNTER — Encounter (HOSPITAL_COMMUNITY): Payer: Self-pay | Admitting: Cardiology

## 2021-08-14 ENCOUNTER — Ambulatory Visit: Payer: Managed Care, Other (non HMO) | Admitting: Cardiology

## 2021-08-14 ENCOUNTER — Other Ambulatory Visit: Payer: Self-pay

## 2021-08-14 ENCOUNTER — Encounter: Payer: Self-pay | Admitting: Cardiology

## 2021-08-14 VITALS — BP 115/67 | HR 51 | Temp 98.3°F | Resp 16 | Ht 66.0 in | Wt 138.0 lb

## 2021-08-14 DIAGNOSIS — I361 Nonrheumatic tricuspid (valve) insufficiency: Secondary | ICD-10-CM

## 2021-08-14 DIAGNOSIS — I341 Nonrheumatic mitral (valve) prolapse: Secondary | ICD-10-CM

## 2021-08-14 DIAGNOSIS — I340 Nonrheumatic mitral (valve) insufficiency: Secondary | ICD-10-CM

## 2021-08-14 DIAGNOSIS — I480 Paroxysmal atrial fibrillation: Secondary | ICD-10-CM

## 2021-08-14 NOTE — Progress Notes (Addendum)
Patient referred by Ginger Organ., MD for chest pain  Subjective:   Vanessa Sanchez, female    DOB: April 22, 1961, 60 y.o.   MRN: [id number]   Chief Complaint  Patient presents with   Mitral Valve Prolapse   Atrial Fibrillation   Follow-up    HPI  60 y.o. Caucasian female with h/o ASD repair (1988), mitral valve prolapse with moderate to severe mitral regurgitation, moderate mitral regurgitation, paroxysmal A. Fib  Patient is doing well since her TEE guided cardioversion.  However, she still has mild exertional dyspnea.  She recently saw Dr. Lunette Stands at [facility] cardiac surgery.  Patient relayed his recommendations to me.  Following his mild ascending.    Given relatively underwhelming mitral regurgitation on TEE, albeit under sedation, he would like to review baseline transthoracic echocardiogram performed in the office to make further decision regarding surgery.  Also, should she have surgery, would likely benefit from annuloplasty to both mitral and tricuspid valve, along with Afib ablation Also, given her prior ASD repair and related scar tissue, mini-thoracotomoy is not possible.     Current Outpatient Medications on File Prior to Visit  Medication Sig Dispense Refill   apixaban (ELIQUIS) 5 MG TABS tablet Take 1 tablet (5 mg total) by mouth 2 (two) times daily. 60 tablet 2   diltiazem (CARDIZEM CD) 120 MG 24 hr capsule Take 1 capsule (120 mg total) by mouth daily. 30 capsule 3   metoprolol tartrate (LOPRESSOR) 25 MG tablet TAKE 1/2 TABLET AS DIRECTED. 30 tablet 3   No current facility-administered medications on file prior to visit.    Cardiovascular studies:   EKG 08/14/2021: Sinus rhythm 57 bpm  TEE 08/03/2021:  1. Left ventricular ejection fraction, by estimation, is 55 to 60%. The  left ventricle has normal function.   2. Right ventricular systolic function is normal. The right ventricular  size is normal.   3. Spontaneous echo contrast seen in left atrium  without LA/LAA thrombus.   4. 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 Two separate MR  jets, both moderate in severity.   5. Tricuspid valve regurgitation is moderate to severe.   6. The aortic valve is normal in structure. Aortic valve regurgitation is  not visualized. No aortic stenosis is present.   7. The left upper pulmonary vein, right upper pulmonary vein and right  lower pulmonary vein are abnormal showing systolic blunting.   EKG 07/20/2021: Atrial fibrillation 123 bpm  Echocardiogram 07/20/2021:  Left ventricle cavity is normal in size and wall thickness. Normal global  wall motion. Normal LV systolic function with EF 55%. Indeterminate  diastolic filling pattern.  Left atrial cavity is severely dilated.  Moderate prolapse of the mitral valve leaflets. Moderate to severe mitral  regurgitation.  Moderate tricuspid regurgitation. Estimated pulmonary artery systolic  pressure 38 mmHg.  Compared to previous study in 2021, tachycardia is new. Estimated PASP is  increased from 31 mmHg to 38 mmHg.   Treadmill Exercise Stress 07/29/2019: The patient exercised for 10:00 min on Bruce protocol; achieved 11.81 METs at 100% of maximum predicted heart rate.  The baseline blood pressure was 120/80 mmHg and increased to 180/70 mmHg at peak exercise. Chest pain is not present. Exercise was terminated due to fatigue/weakness. Resting EKG normal sinus rhythm with 1 mm inferolateral ST depression with T-wave inversion.  Stress EKG positive for myocardial ischemia, 3 mm ST segment depression with T-wave inversion in inferior and lateral leads persisting for >3 minutes into  recovery.  Rare PVCs.  Holter monitor 07/08/2019: Dominant rhythm sinus.  Heart rate 43-143 bpm.  Average heart rate 66 bpm. Less than 1% supraventricular ectopy 2.8% ventricular ectopy.  No nonsustained or sustained ventricular tachycardia. No other arrhythmias. No symptoms reported.  Recent  labs: 07/07/2019: D-dimer 0.22 mg/L  09/09/2018: H/H 14/43.  MCV 102.  Platelets 213. Glucose 83.  BUN/creatinine 15/0.8.  EGFR 73.  Sodium 144, potassium 4.6.  Rest of the CMP normal. Cholesterol 237, triglycerides 90, HDL 82, LDL 137. TSH normal.   Review of Systems  Cardiovascular:  Positive for palpitations. Negative for chest pain, dyspnea on exertion, leg swelling and syncope.       Vitals:   08/14/21 1315  BP: 115/67  Pulse: (!) 51  Resp: 16  Temp: 98.3 F (36.8 C)  SpO2: 99%      Objective:   Physical Exam Vitals and nursing note reviewed.  Constitutional:      General: She is not in acute distress. Neck:     Vascular: No JVD.  Cardiovascular:     Rate and Rhythm: Normal rate and regular rhythm.     Heart sounds: Murmur heard.  High-pitched blowing holosystolic murmur is present with a grade of 3/6 at the apex.  Pulmonary:     Effort: Pulmonary effort is normal.     Breath sounds: Normal breath sounds. No wheezing or rales.        Assessment & Recommendations:    60 y.o. Caucasian female with h/o ASD repair (1988), mitral valve prolapse with moderate to severe mitral regurgitation, moderate mitral regurgitation, now with paroxysmal A. Fib  PAF: Maintaining sinus rhythm since TEE/cardioversion 08/03/2021 She is only taking metoprolol tartrate 12.5 mg once a day, with HR being in low 50s. I have discontinued this and only continued diltiazem 120 mg a day. While CHA2DS2VAsc score is 1, with annual stroke risk of 0.6%, given recent for TEE guided cardioversion, continue Eliquis 5 mg twice daily.  Mitral and tricuspid regurgitation: Primary MR with mitral valve prolapse. INR was thought to be moderate to severe and transthoracic echocardiogram, it appeared moderate on TEE 08/03/2021, albeit under deep sedation. I will relay images of her baseline transthoracic echocardiogram to Dr. Judeth Porch office for further decision-making regarding mitral and tricuspid  valve repair. In the meantime, I will proceed with left and right heart catheterization for evaluation of coronary anatomy and filling pressures.  Mitral valve prolapse: Moderate to severe MR, with severe left atrial dilation and new onset of A. fib.  Estimated PASP 38 mmHg on transthoracic echocardiogram.   I recommend consideration for mitral valve repair given the above findings.   Will refer her to [facility].   If decision is made to proceed with mitral valve repair, I will be happy to perform coronary angiography prior to mitral valve repair.   I will send CD of her TEE with her for her evaluation at [facility]    H/o ASD repair: Intact by recent echocardiogram 07/2019.  F/u in 3 months  [place], MD [facility] [place] Pager: [phone] Office: [phone] If no answer Cell [phone]

## 2021-08-15 LAB — CBC
Hematocrit: 41.8 % (ref 34.0–46.6)
Hemoglobin: 13.7 g/dL (ref 11.1–15.9)
MCH: 31.3 pg (ref 26.6–33.0)
MCHC: 32.8 g/dL (ref 31.5–35.7)
MCV: 95 fL (ref 79–97)
Platelets: 286 10*3/uL (ref 150–450)
RBC: 4.38 x10E6/uL (ref 3.77–5.28)
RDW: 11.9 % (ref 11.7–15.4)
WBC: 5.5 10*3/uL (ref 3.4–10.8)

## 2021-08-15 LAB — BASIC METABOLIC PANEL
BUN/Creatinine Ratio: 18 (ref 12–28)
BUN: 15 mg/dL (ref 8–27)
CO2: 25 mmol/L (ref 20–29)
Calcium: 9.5 mg/dL (ref 8.7–10.3)
Chloride: 104 mmol/L (ref 96–106)
Creatinine, Ser: 0.82 mg/dL (ref 0.57–1.00)
Glucose: 90 mg/dL (ref 70–99)
Potassium: 4.3 mmol/L (ref 3.5–5.2)
Sodium: 140 mmol/L (ref 134–144)
eGFR: 81 mL/min/{1.73_m2} (ref >59)

## 2021-08-16 ENCOUNTER — Ambulatory Visit: Payer: Managed Care, Other (non HMO) | Admitting: Cardiology

## 2021-08-29 ENCOUNTER — Encounter (HOSPITAL_COMMUNITY)
Admission: RE | Disposition: A | Discharge status: Home / Self Care | Payer: Self-pay | Source: Home / Self Care | Attending: Cardiology

## 2021-08-29 ENCOUNTER — Ambulatory Visit (HOSPITAL_COMMUNITY)
Admission: RE | Admit: 2021-08-29 | Discharge: 2021-08-29 | Disposition: A | Discharge status: Home / Self Care | Payer: Managed Care, Other (non HMO) | Attending: Cardiology | Admitting: Cardiology

## 2021-08-29 ENCOUNTER — Other Ambulatory Visit: Payer: Self-pay

## 2021-08-29 DIAGNOSIS — I480 Paroxysmal atrial fibrillation: Secondary | ICD-10-CM | POA: Diagnosis not present

## 2021-08-29 DIAGNOSIS — I340 Nonrheumatic mitral (valve) insufficiency: Secondary | ICD-10-CM | POA: Diagnosis present

## 2021-08-29 DIAGNOSIS — I341 Nonrheumatic mitral (valve) prolapse: Secondary | ICD-10-CM | POA: Diagnosis present

## 2021-08-29 DIAGNOSIS — I081 Rheumatic disorders of both mitral and tricuspid valves: Secondary | ICD-10-CM | POA: Diagnosis not present

## 2021-08-29 HISTORY — PX: RIGHT/LEFT HEART CATH AND CORONARY ANGIOGRAPHY: CATH118266

## 2021-08-29 LAB — POCT I-STAT EG7
Acid-base deficit: 1 mmol/L (ref 0.0–2.0)
Acid-base deficit: 1 mmol/L (ref 0.0–2.0)
Acid-base deficit: 1 mmol/L (ref 0.0–2.0)
Acid-base deficit: 1 mmol/L (ref 0.0–2.0)
Acid-base deficit: 1 mmol/L (ref 0.0–2.0)
Bicarbonate: 24.5 mmol/L (ref 20.0–28.0)
Bicarbonate: 24.5 mmol/L (ref 20.0–28.0)
Bicarbonate: 25.2 mmol/L (ref 20.0–28.0)
Bicarbonate: 25.2 mmol/L (ref 20.0–28.0)
Bicarbonate: 25.4 mmol/L (ref 20.0–28.0)
Calcium, Ion: 1.14 mmol/L — ABNORMAL LOW (ref 1.15–1.40)
Calcium, Ion: 1.16 mmol/L (ref 1.15–1.40)
Calcium, Ion: 1.17 mmol/L (ref 1.15–1.40)
Calcium, Ion: 1.2 mmol/L (ref 1.15–1.40)
Calcium, Ion: 1.2 mmol/L (ref 1.15–1.40)
HCT: 38 % (ref 36.0–46.0)
HCT: 39 % (ref 36.0–46.0)
HCT: 39 % (ref 36.0–46.0)
HCT: 40 % (ref 36.0–46.0)
HCT: 40 % (ref 36.0–46.0)
Hemoglobin: 12.9 g/dL (ref 12.0–15.0)
Hemoglobin: 13.3 g/dL (ref 12.0–15.0)
Hemoglobin: 13.3 g/dL (ref 12.0–15.0)
Hemoglobin: 13.6 g/dL (ref 12.0–15.0)
Hemoglobin: 13.6 g/dL (ref 12.0–15.0)
O2 Saturation: 77 %
O2 Saturation: 78 %
O2 Saturation: 79 %
O2 Saturation: 81 %
O2 Saturation: 82 %
Potassium: 3.6 mmol/L (ref 3.5–5.1)
Potassium: 3.7 mmol/L (ref 3.5–5.1)
Potassium: 3.7 mmol/L (ref 3.5–5.1)
Potassium: 3.7 mmol/L (ref 3.5–5.1)
Potassium: 3.8 mmol/L (ref 3.5–5.1)
Sodium: 141 mmol/L (ref 135–145)
Sodium: 141 mmol/L (ref 135–145)
Sodium: 142 mmol/L (ref 135–145)
Sodium: 142 mmol/L (ref 135–145)
Sodium: 142 mmol/L (ref 135–145)
TCO2: 26 mmol/L (ref 22–32)
TCO2: 26 mmol/L (ref 22–32)
TCO2: 27 mmol/L (ref 22–32)
TCO2: 27 mmol/L (ref 22–32)
TCO2: 27 mmol/L (ref 22–32)
pCO2, Ven: 43.1 mmHg — ABNORMAL LOW (ref 44.0–60.0)
pCO2, Ven: 44.6 mmHg (ref 44.0–60.0)
pCO2, Ven: 46.3 mmHg (ref 44.0–60.0)
pCO2, Ven: 46.5 mmHg (ref 44.0–60.0)
pCO2, Ven: 47 mmHg (ref 44.0–60.0)
pH, Ven: 7.341 (ref 7.250–7.430)
pH, Ven: 7.342 (ref 7.250–7.430)
pH, Ven: 7.344 (ref 7.250–7.430)
pH, Ven: 7.348 (ref 7.250–7.430)
pH, Ven: 7.362 (ref 7.250–7.430)
pO2, Ven: 45 mmHg (ref 32.0–45.0)
pO2, Ven: 45 mmHg (ref 32.0–45.0)
pO2, Ven: 46 mmHg — ABNORMAL HIGH (ref 32.0–45.0)
pO2, Ven: 48 mmHg — ABNORMAL HIGH (ref 32.0–45.0)
pO2, Ven: 49 mmHg — ABNORMAL HIGH (ref 32.0–45.0)

## 2021-08-29 LAB — POCT I-STAT 7, (LYTES, BLD GAS, ICA,H+H)
Acid-Base Excess: 0 mmol/L (ref 0.0–2.0)
Bicarbonate: 24.6 mmol/L (ref 20.0–28.0)
Calcium, Ion: 1.13 mmol/L — ABNORMAL LOW (ref 1.15–1.40)
HCT: 37 % (ref 36.0–46.0)
Hemoglobin: 12.6 g/dL (ref 12.0–15.0)
O2 Saturation: 99 %
Potassium: 3.7 mmol/L (ref 3.5–5.1)
Sodium: 142 mmol/L (ref 135–145)
TCO2: 26 mmol/L (ref 22–32)
pCO2 arterial: 40.9 mmHg (ref 32.0–48.0)
pH, Arterial: 7.387 (ref 7.350–7.450)
pO2, Arterial: 120 mmHg — ABNORMAL HIGH (ref 83.0–108.0)

## 2021-08-29 SURGERY — RIGHT/LEFT HEART CATH AND CORONARY ANGIOGRAPHY
Anesthesia: LOCAL

## 2021-08-29 MED ORDER — SODIUM CHLORIDE 0.9% FLUSH: 3.0000 mL | INTRAVENOUS | Status: DC | PRN

## 2021-08-29 MED ORDER — MIDAZOLAM HCL 2 MG/2ML IJ SOLN
INTRAMUSCULAR | Status: AC
  Filled 2021-08-29: qty 2

## 2021-08-29 MED ORDER — SODIUM CHLORIDE 0.9 % IV SOLN: INTRAVENOUS | Status: DC

## 2021-08-29 MED ORDER — IOHEXOL 350 MG/ML SOLN
INTRAVENOUS | Status: DC | PRN
  Administered 2021-08-29: 30 mL

## 2021-08-29 MED ORDER — VERAPAMIL HCL 2.5 MG/ML IV SOLN
INTRAVENOUS | Status: AC
  Filled 2021-08-29: qty 2

## 2021-08-29 MED ORDER — SODIUM CHLORIDE 0.9% FLUSH: 3.0000 mL | Freq: Two times a day (BID) | INTRAVENOUS | Status: DC

## 2021-08-29 MED ORDER — ONDANSETRON HCL 4 MG/2ML IJ SOLN: 4.0000 mg | Freq: Four times a day (QID) | INTRAMUSCULAR | Status: DC | PRN

## 2021-08-29 MED ORDER — FENTANYL CITRATE (PF) 100 MCG/2ML IJ SOLN
INTRAMUSCULAR | Status: AC
  Filled 2021-08-29: qty 2

## 2021-08-29 MED ORDER — VERAPAMIL HCL 2.5 MG/ML IV SOLN
INTRAVENOUS | Status: DC | PRN
  Administered 2021-08-29: 10 mL via INTRA_ARTERIAL

## 2021-08-29 MED ORDER — LABETALOL HCL 5 MG/ML IV SOLN: 10.0000 mg | INTRAVENOUS | Status: DC | PRN

## 2021-08-29 MED ORDER — HEPARIN (PORCINE) IN NACL 1000-0.9 UT/500ML-% IV SOLN
INTRAVENOUS | Status: DC | PRN
  Administered 2021-08-29 (×2): 500 mL

## 2021-08-29 MED ORDER — SODIUM CHLORIDE 0.9 % IV SOLN: 250.0000 mL | INTRAVENOUS | Status: DC | PRN

## 2021-08-29 MED ORDER — ACETAMINOPHEN 325 MG PO TABS: 650.0000 mg | ORAL_TABLET | ORAL | Status: DC | PRN

## 2021-08-29 MED ORDER — SODIUM CHLORIDE 0.9 % WEIGHT BASED INFUSION
3.0000 mL/kg/h | INTRAVENOUS | Status: AC
  Administered 2021-08-29: 3 mL/kg/h via INTRAVENOUS

## 2021-08-29 MED ORDER — HEPARIN (PORCINE) IN NACL 1000-0.9 UT/500ML-% IV SOLN
INTRAVENOUS | Status: AC
  Filled 2021-08-29: qty 500

## 2021-08-29 MED ORDER — HEPARIN SODIUM (PORCINE) 1000 UNIT/ML IJ SOLN
INTRAMUSCULAR | Status: AC
  Filled 2021-08-29: qty 1

## 2021-08-29 MED ORDER — FENTANYL CITRATE (PF) 100 MCG/2ML IJ SOLN
INTRAMUSCULAR | Status: DC | PRN
  Administered 2021-08-29: 50 ug via INTRAVENOUS
  Administered 2021-08-29 (×2): 25 ug via INTRAVENOUS

## 2021-08-29 MED ORDER — HYDRALAZINE HCL 20 MG/ML IJ SOLN: 10.0000 mg | INTRAMUSCULAR | Status: DC | PRN

## 2021-08-29 MED ORDER — MIDAZOLAM HCL 2 MG/2ML IJ SOLN
INTRAMUSCULAR | Status: DC | PRN
  Administered 2021-08-29 (×3): 1 mg via INTRAVENOUS

## 2021-08-29 MED ORDER — LIDOCAINE HCL (PF) 1 % IJ SOLN
INTRAMUSCULAR | Status: DC | PRN
  Administered 2021-08-29 (×2): 2 mL via INTRADERMAL

## 2021-08-29 MED ORDER — LIDOCAINE HCL (PF) 1 % IJ SOLN
INTRAMUSCULAR | Status: AC
  Filled 2021-08-29: qty 30

## 2021-08-29 MED ORDER — SODIUM CHLORIDE 0.9 % WEIGHT BASED INFUSION: 1.0000 mL/kg/h | INTRAVENOUS | Status: DC

## 2021-08-29 MED ORDER — HEPARIN SODIUM (PORCINE) 1000 UNIT/ML IJ SOLN
INTRAMUSCULAR | Status: DC | PRN
  Administered 2021-08-29: 3000 [IU] via INTRAVENOUS

## 2021-08-29 MED ORDER — ASPIRIN 81 MG PO CHEW
81.0000 mg | CHEWABLE_TABLET | ORAL | Status: AC
  Administered 2021-08-29: 81 mg via ORAL
  Filled 2021-08-29: qty 1

## 2021-08-29 SURGICAL SUPPLY — 13 items
CATH BALLN WEDGE 5F 110CM (CATHETERS) ×2 IMPLANT
CATH OPTITORQUE TIG 4.0 5F (CATHETERS) ×2 IMPLANT
DEVICE RAD TR BAND REGULAR (VASCULAR PRODUCTS) ×2 IMPLANT
GLIDESHEATH SLEND A-KIT 6F 22G (SHEATH) ×2 IMPLANT
GUIDEWIRE .025 260CM (WIRE) ×2 IMPLANT
GUIDEWIRE INQWIRE 1.5J.035X260 (WIRE) ×1 IMPLANT
INQWIRE 1.5J .035X260CM (WIRE) ×2
KIT HEART LEFT (KITS) ×2 IMPLANT
PACK CARDIAC CATHETERIZATION (CUSTOM PROCEDURE TRAY) ×2 IMPLANT
SHEATH GLIDE SLENDER 4/5FR (SHEATH) ×2 IMPLANT
SHEATH PROBE COVER 6X72 (BAG) ×2 IMPLANT
TRANSDUCER W/STOPCOCK (MISCELLANEOUS) ×2 IMPLANT
TUBING CIL FLEX 10 FLL-RA (TUBING) ×2 IMPLANT

## 2021-08-29 NOTE — H&P (Signed)
OV 08/14/2021 copied for documentation     Patient referred by Shaw, William D Jr., MD for chest pain  Subjective:   Vanessa Sanchez, female    DOB: 02/13/1961, 60 y.o.   MRN: [id number]   Chief Complaint  Patient presents with   Mitral Valve Prolapse   Atrial Fibrillation   Follow-up    HPI  60 y.o. Caucasian female with h/o ASD repair (1988), mitral valve prolapse with moderate to severe mitral regurgitation, moderate mitral regurgitation, paroxysmal A. Fib  Patient is doing well since her TEE guided cardioversion.  However, she still has mild exertional dyspnea.  She recently saw Dr. Kincaid at [facility].  Patient relayed his recommendations to me.  Following his mild ascending.    Given relatively underwhelming mitral regurgitation on TEE, albeit under sedation, he would like to review baseline transthoracic echocardiogram performed in the office to make further decision regarding surgery.  Also, should she have surgery, would likely benefit from annuloplasty to both mitral and tricuspid valve, along with Afib ablation Also, given her prior ASD repair and related scar tissue, mini-thoracotomoy is not possible.     Current Outpatient Medications on File Prior to Visit  Medication Sig Dispense Refill   apixaban (ELIQUIS) 5 MG TABS tablet Take 1 tablet (5 mg total) by mouth 2 (two) times daily. 60 tablet 2   diltiazem (CARDIZEM CD) 120 MG 24 hr capsule Take 1 capsule (120 mg total) by mouth daily. 30 capsule 3   metoprolol tartrate (LOPRESSOR) 25 MG tablet TAKE 1/2 TABLET AS DIRECTED. 30 tablet 3   No current facility-administered medications on file prior to visit.    Cardiovascular studies:   EKG 08/14/2021: Sinus rhythm 57 bpm  TEE 08/03/2021:  1. Left ventricular ejection fraction, by estimation, is 55 to 60%. The  left ventricle has normal function.   2. Right ventricular systolic function is normal. The right ventricular  size is normal.   3.  Spontaneous echo contrast seen in left atrium without LA/LAA thrombus.   4. 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 Two separate MR  jets, both moderate in severity.   5. Tricuspid valve regurgitation is moderate to severe.   6. The aortic valve is normal in structure. Aortic valve regurgitation is  not visualized. No aortic stenosis is present.   7. The left upper pulmonary vein, right upper pulmonary vein and right  lower pulmonary vein are abnormal showing systolic blunting.   EKG 07/20/2021: Atrial fibrillation 123 bpm  Echocardiogram 07/20/2021:  Left ventricle cavity is normal in size and wall thickness. Normal global  wall motion. Normal LV systolic function with EF 55%. Indeterminate  diastolic filling pattern.  Left atrial cavity is severely dilated.  Moderate prolapse of the mitral valve leaflets. Moderate to severe mitral  regurgitation.  Moderate tricuspid regurgitation. Estimated pulmonary artery systolic  pressure 38 mmHg.  Compared to previous study in 2021, tachycardia is new. Estimated PASP is  increased from 31 mmHg to 38 mmHg.   Treadmill Exercise Stress 07/29/2019: The patient exercised for 10:00 min on Bruce protocol; achieved 11.81 METs at 100% of maximum predicted heart rate.  The baseline blood pressure was 120/80 mmHg and increased to 180/70 mmHg at peak exercise. Chest pain is not present. Exercise was terminated due to fatigue/weakness. Resting EKG normal sinus rhythm with 1 mm inferolateral ST depression with T-wave inversion.  Stress EKG positive for myocardial ischemia, 3 mm ST segment depression with T-wave inversion in inferior and   lateral leads persisting for >3 minutes into recovery.  Rare PVCs.  Holter monitor 07/08/2019: Dominant rhythm sinus.  Heart rate 43-143 bpm.  Average heart rate 66 bpm. Less than 1% supraventricular ectopy 2.8% ventricular ectopy.  No nonsustained or sustained ventricular tachycardia. No other arrhythmias. No  symptoms reported.  Recent labs: 07/07/2019: D-dimer 0.22 mg/L  09/09/2018: H/H 14/43.  MCV 102.  Platelets 213. Glucose 83.  BUN/creatinine 15/0.8.  EGFR 73.  Sodium 144, potassium 4.6.  Rest of the CMP normal. Cholesterol 237, triglycerides 90, HDL 82, LDL 137. TSH normal.   Review of Systems  Cardiovascular:  Positive for palpitations. Negative for chest pain, dyspnea on exertion, leg swelling and syncope.       Vitals:   08/14/21 1315  BP: 115/67  Pulse: (!) 51  Resp: 16  Temp: 98.3 F (36.8 C)  SpO2: 99%      Objective:   Physical Exam Vitals and nursing note reviewed.  Constitutional:      General: She is not in acute distress. Neck:     Vascular: No JVD.  Cardiovascular:     Rate and Rhythm: Normal rate and regular rhythm.     Heart sounds: Normal heart sounds. No murmur heard. Pulmonary:     Effort: Pulmonary effort is normal.     Breath sounds: Normal breath sounds. No wheezing or rales.        Assessment & Recommendations:    60 y.o. Caucasian female with h/o ASD repair (1988), mitral valve prolapse with moderate to severe mitral regurgitation, moderate mitral regurgitation, now with paroxysmal A. Fib  PAF: Maintaining sinus rhythm since TEE/cardioversion 08/03/2021 She is only taking metoprolol tartrate 12.5 mg once a day, with HR being in low 50s. I have discontinued this and only continued diltiazem 120 mg a day. While CHA2DS2VAsc score is 1, with annual stroke risk of 0.6%, given recent for TEE guided cardioversion, continue Eliquis 5 mg twice daily.  Mitral and tricuspid regurgitation: Primary MR with mitral valve prolapse. INR was thought to be moderate to severe and transthoracic echocardiogram, it appeared moderate on TEE 08/03/2021, albeit under deep sedation. I will relay images of her baseline transthoracic echocardiogram to Dr. Judeth Porch office for further decision-making regarding mitral and tricuspid valve repair. In the meantime, I  will proceed with left and right heart catheterization for evaluation of coronary anatomy and filling pressures.  Mitral valve prolapse: Moderate to severe MR, with severe left atrial dilation and new onset of A. fib.  Estimated PASP 38 mmHg on transthoracic echocardiogram.   I recommend consideration for mitral valve repair given the above findings.   Will refer her to [facility].   If decision is made to proceed with mitral valve repair, I will be happy to perform coronary angiography prior to mitral valve repair.   I will send CD of her TEE with her for her evaluation at [facility].    H/o ASD repair: Intact by recent echocardiogram 07/2019.  F/u in 3 months  [place], MD [facility] Pager: [phone] Office: [phone] If no answer Cell [phone]

## 2021-08-29 NOTE — Discharge Instructions (Signed)
Radial Site Care  This sheet gives you information about how to care for yourself after your procedure. Your health care provider may also give you more specific instructions. If you have problems or questions, contact your health care provider. What can I expect after the procedure? After the procedure, it is common to have: Bruising and tenderness at the catheter insertion area. Follow these instructions at home: Medicines Take over-the-counter and prescription medicines only as told by your health care provider. Insertion site care Follow instructions from your health care provider about how to take care of your insertion site. Make sure you: Wash your hands with soap and water before you remove your bandage (dressing). If soap and water are not available, use hand sanitizer. May remove dressing in 24 hours. Check your insertion site every day for signs of infection. Check for: Redness, swelling, or pain. Fluid or blood. Pus or a bad smell. Warmth. Do no take baths, swim, or use a hot tub for 5 days. You may shower 24-48 hours after the procedure. Remove the dressing and gently wash the site with plain soap and water. Pat the area dry with a clean towel. Do not rub the site. That could cause bleeding. Do not apply powder or lotion to the site. Activity  For 24 hours after the procedure, or as directed by your health care provider: Do not flex or bend the affected arm. Do not push or pull heavy objects with the affected arm. Do not drive yourself home from the hospital or clinic. You may drive 24 hours after the procedure. Do not operate machinery or power tools. KEEP ARM ELEVATED THE REMAINDER OF THE DAY. Do not push, pull or lift anything that is heavier than 10 lb for 5 days. Ask your health care provider when it is okay to: Return to work or school. Resume usual physical activities or sports. Resume sexual activity. General instructions If the catheter site starts to  bleed, raise your arm and put firm pressure on the site. If the bleeding does not stop, get help right away. This is a medical emergency. DRINK PLENTY OF FLUIDS FOR THE NEXT 2-3 DAYS. No alcohol consumption for 24 hours after receiving sedation. If you went home on the same day as your procedure, a responsible adult should be with you for the first 24 hours after you arrive home. Keep all follow-up visits as told by your health care provider. This is important. Contact a health care provider if: You have a fever. You have redness, swelling, or yellow drainage around your insertion site. Get help right away if: You have unusual pain at the radial site. The catheter insertion area swells very fast. The insertion area is bleeding, and the bleeding does not stop when you hold steady pressure on the area. Your arm or hand becomes pale, cool, tingly, or numb. These symptoms may represent a serious problem that is an emergency. Do not wait to see if the symptoms will go away. Get medical help right away. Call your local emergency services (911 in the [place] Do not drive yourself to the hospital. Summary After the procedure, it is common to have bruising and tenderness at the site. Follow instructions from your health care provider about how to take care of your radial site wound. Check the wound every day for signs of infection.  This information is not intended to replace advice given to you by your health care provider. Make sure you discuss any questions you have with   your health care provider. Document Revised: 10/30/2017 Document Reviewed: 10/30/2017 Elsevier Patient Education  2020 Elsevier Inc.  

## 2021-08-29 NOTE — Interval H&P Note (Signed)
History and Physical Interval Note:  08/29/2021 1:13 PM  Vanessa Sanchez  has presented today for surgery, with the diagnosis of chest pain.  The various methods of treatment have been discussed with the patient and family. After consideration of risks, benefits and other options for treatment, the patient has consented to  Procedure(s): RIGHT/LEFT HEART CATH AND CORONARY ANGIOGRAPHY (N/A) as a surgical intervention.  The patient's history has been reviewed, patient examined, no change in status, stable for surgery.  I have reviewed the patient's chart and labs.  Questions were answered to the patient's satisfaction.    2012 Appropriate Use Criteria for Diagnostic Catheterization Valvular Disease (Right and Left Heart Catheterization or Right Heart Catheterization Alone With or Without Left Ventriculography and Coronary Angiography) Indication:  Preoperative assessment before valvular surgery A (7) Indication: 70; Score 7   Alexee Delsanto J Kieren Ricci

## 2021-08-30 ENCOUNTER — Encounter (HOSPITAL_COMMUNITY): Payer: Self-pay | Admitting: Cardiology

## 2021-09-03 ENCOUNTER — Encounter: Payer: Self-pay | Admitting: Cardiology

## 2021-09-04 ENCOUNTER — Ambulatory Visit: Payer: Managed Care, Other (non HMO) | Admitting: Cardiology

## 2021-09-04 NOTE — Telephone Encounter (Signed)
From pt

## 2021-09-05 ENCOUNTER — Ambulatory Visit: Payer: Managed Care, Other (non HMO) | Admitting: Obstetrics and Gynecology

## 2021-09-06 ENCOUNTER — Ambulatory Visit: Payer: Managed Care, Other (non HMO) | Admitting: Obstetrics and Gynecology

## 2021-09-07 ENCOUNTER — Ambulatory Visit: Payer: Managed Care, Other (non HMO) | Admitting: Nurse Practitioner
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
## 2021-09-08 ENCOUNTER — Other Ambulatory Visit: Payer: Self-pay

## 2021-09-08 ENCOUNTER — Ambulatory Visit (INDEPENDENT_AMBULATORY_CARE_PROVIDER_SITE_OTHER): Payer: Managed Care, Other (non HMO) | Admitting: Obstetrics and Gynecology

## 2021-09-08 ENCOUNTER — Encounter: Payer: Self-pay | Admitting: Obstetrics and Gynecology

## 2021-09-08 VITALS — BP 122/74 | HR 62 | Ht 66.0 in | Wt 140.0 lb

## 2021-09-08 DIAGNOSIS — Z01419 Encounter for gynecological examination (general) (routine) without abnormal findings: Secondary | ICD-10-CM | POA: Diagnosis not present

## 2021-09-08 NOTE — Patient Instructions (Addendum)
You can try colace 2 x a day (stool softener).  If needed take miralax.  EXERCISE   We recommended that you start or continue a regular exercise program for good health. Physical activity is anything that gets your body moving, some is better than none. The CDC recommends 150 minutes per week of Moderate-Intensity Aerobic Activity and 2 or more days of Muscle Strengthening Activity.  Benefits of exercise are limitless: helps weight loss/weight maintenance, improves mood and energy, helps with depression and anxiety, improves sleep, tones and strengthens muscles, improves balance, improves bone density, protects from chronic conditions such as heart disease, high blood pressure and diabetes and so much more. To learn more visit: WhyNotPoker.uy  DIET: Good nutrition starts with a healthy diet of fruits, vegetables, whole grains, and lean protein sources. Drink plenty of water for hydration. Minimize empty calories, sodium, sweets. For more information about dietary recommendations visit: GeekRegister.com.ee and http://schaefer-mitchell.com/  ALCOHOL:  Women should limit their alcohol intake to no more than 7 drinks/beers/glasses of wine (combined, not each!) per week. Moderation of alcohol intake to this level decreases your risk of breast cancer and liver damage.  If you are concerned that you may have a problem, or your friends have told you they are concerned about your drinking, there are many resources to help. A well-known program that is free, effective, and available to all people all over the nation is Alcoholics Anonymous.  Check out this site to learn more: BlockTaxes.se   CALCIUM AND VITAMIN D:  Adequate intake of calcium and Vitamin D are recommended for bone health.  You should be getting between 1000-1200 mg of calcium and 800 units of Vitamin D daily between diet and supplements  PAP SMEARS:  Pap  smears, to check for cervical cancer or precancers,  have traditionally been done yearly, scientific advances have shown that most women can have pap smears less often.  However, every woman still should have a physical exam from her gynecologist every year. It will include a breast check, inspection of the vulva and vagina to check for abnormal growths or skin changes, a visual exam of the cervix, and then an exam to evaluate the size and shape of the uterus and ovaries. We will also provide age appropriate advice regarding health maintenance, like when you should have certain vaccines, screening for sexually transmitted diseases, bone density testing, colonoscopy, mammograms, etc.   MAMMOGRAMS:  All women over 64 years old should have a routine mammogram.   COLON CANCER SCREENING: Now recommend starting at age 20. At this time colonoscopy is not covered for routine screening until 50. There are take home tests that can be done between 45-49.   COLONOSCOPY:  Colonoscopy to screen for colon cancer is recommended for all women at age 26.  We know, you hate the idea of the prep.  We agree, BUT, having colon cancer and not knowing it is worse!!  Colon cancer so often starts as a polyp that can be seen and removed at colonscopy, which can quite literally save your life!  And if your first colonoscopy is normal and you have no family history of colon cancer, most women don't have to have it again for 10 years.  Once every ten years, you can do something that may end up saving your life, right?  We will be happy to help you get it scheduled when you are ready.  Be sure to check your insurance coverage so you understand how much it will cost.  It may be covered as a preventative service at no cost, but you should check your particular policy.      Breast Self-Awareness Breast self-awareness means being familiar with how your breasts look and feel. It involves checking your breasts regularly and reporting any  changes to your health care provider. Practicing breast self-awareness is important. A change in your breasts can be a sign of a serious medical problem. Being familiar with how your breasts look and feel allows you to find any problems early, when treatment is more likely to be successful. All women should practice breast self-awareness, including women who have had breast implants. How to do a breast self-exam One way to learn what is normal for your breasts and whether your breasts are changing is to do a breast self-exam. To do a breast self-exam: Look for Changes  Remove all the clothing above your waist. Stand in front of a mirror in a room with good lighting. Put your hands on your hips. Push your hands firmly downward. Compare your breasts in the mirror. Look for differences between them (asymmetry), such as: Differences in shape. Differences in size. Puckers, dips, and bumps in one breast and not the other. Look at each breast for changes in your skin, such as: Redness. Scaly areas. Look for changes in your nipples, such as: Discharge. Bleeding. Dimpling. Redness. A change in position. Feel for Changes Carefully feel your breasts for lumps and changes. It is best to do this while lying on your back on the floor and again while sitting or standing in the shower or tub with soapy water on your skin. Feel each breast in the following way: Place the arm on the side of the breast you are examining above your head. Feel your breast with the other hand. Start in the nipple area and make  inch (2 cm) overlapping circles to feel your breast. Use the pads of your three middle fingers to do this. Apply light pressure, then medium pressure, then firm pressure. The light pressure will allow you to feel the tissue closest to the skin. The medium pressure will allow you to feel the tissue that is a little deeper. The firm pressure will allow you to feel the tissue close to the ribs. Continue  the overlapping circles, moving downward over the breast until you feel your ribs below your breast. Move one finger-width toward the center of the body. Continue to use the  inch (2 cm) overlapping circles to feel your breast as you move slowly up toward your collarbone. Continue the up and down exam using all three pressures until you reach your armpit.  Write Down What You Find  Write down what is normal for each breast and any changes that you find. Keep a written record with breast changes or normal findings for each breast. By writing this information down, you do not need to depend only on memory for size, tenderness, or location. Write down where you are in your menstrual cycle, if you are still menstruating. If you are having trouble noticing differences in your breasts, do not get discouraged. With time you will become more familiar with the variations in your breasts and more comfortable with the exam. How often should I examine my breasts? Examine your breasts every month. If you are breastfeeding, the best time to examine your breasts is after a feeding or after using a breast pump. If you menstruate, the best time to examine your breasts is 5-7 days after your  period is over. During your period, your breasts are lumpier, and it may be more difficult to notice changes. When should I see my health care provider? See your health care provider if you notice: A change in shape or size of your breasts or nipples. A change in the skin of your breast or nipples, such as a reddened or scaly area. Unusual discharge from your nipples. A lump or thick area that was not there before. Pain in your breasts. Anything that concerns you.

## 2021-10-06 ENCOUNTER — Encounter (HOSPITAL_COMMUNITY): Payer: Self-pay | Admitting: *Deleted

## 2021-10-06 NOTE — Progress Notes (Signed)
Received notification from Dr Ty Hilts at [facility] of this pt interest in participating in Cardiac rehab s/p 09/15/21 Mini MV Repair, TV Repair and maze procedure.  Reviewed pt admission notes in Care Everywhere.  Pt has upcoming appt with Dr. Ty Hilts on 1/12 and will see local cardiologist Dr. Rosemary Holms. Ideally would like for Dr. Rosemary Holms place electronically referral for cardiac rehab as well as conduct 12 lead ekg in the office during follow up.  Once both of these appt are completed satisfactorily and referral placed may proceed with scheduling.  Continue to follow along for readiness. Alanson Aly, BSN Cardiac and Emergency planning/management officer

## 2021-10-11 ENCOUNTER — Other Ambulatory Visit: Payer: Self-pay | Admitting: Obstetrics and Gynecology

## 2021-10-11 DIAGNOSIS — Z1231 Encounter for screening mammogram for malignant neoplasm of breast: Secondary | ICD-10-CM

## 2021-10-27 ENCOUNTER — Other Ambulatory Visit: Payer: Self-pay | Admitting: Obstetrics and Gynecology

## 2021-10-27 ENCOUNTER — Ambulatory Visit: Payer: Managed Care, Other (non HMO) | Admitting: Cardiology

## 2021-10-27 DIAGNOSIS — Z1231 Encounter for screening mammogram for malignant neoplasm of breast: Secondary | ICD-10-CM

## 2021-10-30 ENCOUNTER — Ambulatory Visit: Payer: Managed Care, Other (non HMO) | Admitting: Cardiology

## 2021-10-30 ENCOUNTER — Other Ambulatory Visit: Payer: Self-pay

## 2021-10-30 ENCOUNTER — Encounter: Payer: Self-pay | Admitting: Cardiology

## 2021-10-30 VITALS — BP 109/68 | HR 72 | Temp 97.9°F | Resp 16 | Ht 66.0 in | Wt 141.2 lb

## 2021-10-30 DIAGNOSIS — I480 Paroxysmal atrial fibrillation: Secondary | ICD-10-CM

## 2021-10-30 DIAGNOSIS — R0609 Other forms of dyspnea: Secondary | ICD-10-CM

## 2021-10-30 DIAGNOSIS — Z1231 Encounter for screening mammogram for malignant neoplasm of breast: Secondary | ICD-10-CM

## 2021-10-30 NOTE — Progress Notes (Signed)
° ° °Patient referred by Shaw, William D Jr., MD for chest pain ° °Subjective:  ° °Vanessa Sanchez, female    DOB: 06/20/1961, 61 y.o.   MRN: [id number] ° ° °Chief Complaint  °Patient presents with  ° PAF  ° Nonrheumatic mitral valve regurgitation  ° Follow-up  °  2 month  ° ° °HPI ° °61 y.o. Caucasian female with h/o ASD repair (1988), primary MR, TR, now s/p MVR, TVR and left intra-atrial CryoMaze via right thoracotomy by Dr Kincaid (09/2021) ° °Patient underwent surgery by Dr Kincaid at [facility] in 09/2021. She is recovering well from surgery. She does report exertional dyspnea with minimal activity since the surgery. She had one episode of elevated heart rate and blood pressure for brief period around 10/16/2021, with no recurrence. She recently had labs through her PCP, all the results not available to me. Patient is starting cardiac rehab tomorrow. ° °She has been on amiodarone since surgery. She is also on metoprolol, eliquis, and Aspirin. ° ° °Current Outpatient Medications on File Prior to Visit  °Medication Sig Dispense Refill  ° amiodarone (PACERONE) 200 MG tablet Take 200 mg by mouth daily.    ° apixaban (ELIQUIS) 5 MG TABS tablet Take 1 tablet (5 mg total) by mouth 2 (two) times daily. 60 tablet 2  ° aspirin 81 MG EC tablet Take 1 tablet by mouth daily.    ° metoprolol succinate (TOPROL-XL) 25 MG 24 hr tablet Take 12.5 mg by mouth daily.    ° °No current facility-administered medications on file prior to visit.  ° ° °Cardiovascular studies:  ° °EKG 10/30/2021: °Sinus rhythm 72 bpm °Anterolateral T wave inversion, consider ischemia ° °Op note 09/15/2021 (Dr. Kincaid) °MVR, TVR and left intra-atrial CryoMaze via right thoracotomy  °  °TEE 08/03/2021: ° 1. Left ventricular ejection fraction, by estimation, is 55 to 60%. The  °left ventricle has normal function.  ° 2. Right ventricular systolic function is normal. The right ventricular  °size is normal.  ° 3. Spontaneous echo contrast seen in left atrium without  LA/LAA thrombus.  ° 4. 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 Two separate MR  °jets, both moderate in severity.  ° 5. Tricuspid valve regurgitation is moderate to severe.  ° 6. The aortic valve is normal in structure. Aortic valve regurgitation is  °not visualized. No aortic stenosis is present.  ° 7. The left upper pulmonary vein, right upper pulmonary vein and right  °lower pulmonary vein are abnormal showing systolic blunting.  ° °EKG 07/20/2021: °Atrial fibrillation 123 bpm ° °Echocardiogram 07/20/2021:  °Left ventricle cavity is normal in size and wall thickness. Normal global  °wall motion. Normal LV systolic function with EF 55%. Indeterminate  °diastolic filling pattern.  °Left atrial cavity is severely dilated.  °Moderate prolapse of the mitral valve leaflets. Moderate to severe mitral  °regurgitation.  °Moderate tricuspid regurgitation. Estimated pulmonary artery systolic  °pressure 38 mmHg.  °Compared to previous study in 2021, tachycardia is new. Estimated PASP is  °increased from 31 mmHg to 38 mmHg.  ° °Treadmill Exercise Stress 07/29/2019: °The patient exercised for 10:00 min on Bruce protocol; achieved 11.81 METs at 100% of maximum predicted heart rate.  The baseline blood pressure was 120/80 mmHg and increased to 180/70 mmHg at peak exercise. °Chest pain is not present. Exercise was terminated due to fatigue/weakness. °Resting EKG normal sinus rhythm with 1 mm inferolateral ST depression with T-wave inversion.  Stress EKG positive for myocardial ischemia, 3 mm   mm ST segment depression with T-wave inversion in inferior and lateral leads persisting for >3 minutes into recovery.  Rare PVCs.  Holter monitor 07/08/2019: Dominant rhythm sinus.  Heart rate 43-143 bpm.  Average heart rate 66 bpm. Less than 1% supraventricular ectopy 2.8% ventricular ectopy.  No nonsustained or sustained ventricular tachycardia. No other arrhythmias. No symptoms reported.  Recent  labs: 09/21/2021: Glucose 89, BUN/Cr 11/0.57. EGFR >90. Na/K 138/4.3. Rest of the CMP normal H/H 10/29. MCV 94. Platelets 266 Chol 140, TG 154, HDL 50, LDL 60  07/07/2019: D-dimer 0.22 mg/L  09/09/2018: H/H 14/43.  MCV 102.  Platelets 213. Glucose 83.  BUN/creatinine 15/0.8.  EGFR 73.  Sodium 144, potassium 4.6.  Rest of the CMP normal. Cholesterol 237, triglycerides 90, HDL 82, LDL 137. TSH normal.   Review of Systems  Cardiovascular:  Positive for dyspnea on exertion and palpitations. Negative for chest pain, leg swelling and syncope.       Vitals:   10/30/21 1143  BP: 109/68  Pulse: 72  Resp: 16  Temp: 97.9 F (36.6 C)  SpO2: 99%      Objective:   Physical Exam Vitals and nursing note reviewed.  Constitutional:      General: She is not in acute distress. Neck:     Vascular: No JVD.  Cardiovascular:     Rate and Rhythm: Normal rate and regular rhythm.     Heart sounds: Normal heart sounds. No murmur heard. Pulmonary:     Effort: Pulmonary effort is normal.     Breath sounds: Normal breath sounds. No wheezing or rales.  Musculoskeletal:     Right lower leg: No edema.     Left lower leg: No edema.        Assessment & Recommendations:   61 y.o. Caucasian female with h/o ASD repair (1988), primary MR, TR, now s/p MVR, TVR and left intra-atrial CryoMaze via right thoracotomy by Dr Lunette Stands (09/2021)  MR, TR: Now s/p MVr, TVr and left intra-atrial CryoMaze via right thoracotomy No residual murmur Exertional dyspnea could be due to post-op anemia, deconditioning, of reduced LVEF. Clinically, she does not look to be in heart failure.  Will check BNP, obtain CBC labs from PCP. Will check echocardiogram and 2 week monitor.  PAF: Currently in sinus rhythm CHA2DS2VAsc score is 1, with annual stroke risk of 0.6%. I am optimistic that she can come off Eliquis, as well as amiodarone, in near future if she has no recurrence of afib, now that she has undergone  successful CryoMaze.  H/o ASD repair: Intact by recent echocardiogram 07/2019.  F/u in 4 weeks  [place], MD [facility] [place] Pager: [phone] Office: [phone] If no answer Cell [phone]

## 2021-10-31 LAB — BRAIN NATRIURETIC PEPTIDE: BNP: 1235.2 pg/mL — ABNORMAL HIGH (ref 0.0–100.0)

## 2021-10-31 MED ORDER — FUROSEMIDE 40 MG PO TABS: 40.0000 mg | ORAL_TABLET | Freq: Every day | ORAL | 3 refills | Status: DC

## 2021-10-31 NOTE — Addendum Note (Signed)
Addended by: Elder Negus on: 10/31/2021 03:42 PM   Modules accepted: Orders

## 2021-11-01 ENCOUNTER — Ambulatory Visit: Payer: Managed Care, Other (non HMO) | Admitting: Cardiology

## 2021-11-01 NOTE — Progress Notes (Signed)
Called and spoke with patient regarding her echocardiogram results. Patient will start Lasix 40mg .  Admin staff : Patient is aware that there is no availabilities earlier than 1/31. But patient stated that if a last minute cancellation comes up, she will be available.

## 2021-11-06 ENCOUNTER — Other Ambulatory Visit: Payer: Self-pay | Admitting: Internal Medicine

## 2021-11-06 ENCOUNTER — Encounter: Payer: Self-pay | Admitting: Cardiology

## 2021-11-06 DIAGNOSIS — F17201 Nicotine dependence, unspecified, in remission: Secondary | ICD-10-CM

## 2021-11-07 ENCOUNTER — Inpatient Hospital Stay: Payer: Managed Care, Other (non HMO)

## 2021-11-07 ENCOUNTER — Other Ambulatory Visit: Payer: Self-pay

## 2021-11-07 ENCOUNTER — Other Ambulatory Visit: Payer: Self-pay | Admitting: Cardiology

## 2021-11-07 ENCOUNTER — Ambulatory Visit: Payer: Managed Care, Other (non HMO)

## 2021-11-07 DIAGNOSIS — R0609 Other forms of dyspnea: Secondary | ICD-10-CM

## 2021-11-07 DIAGNOSIS — I5020 Unspecified systolic (congestive) heart failure: Secondary | ICD-10-CM

## 2021-11-07 DIAGNOSIS — I480 Paroxysmal atrial fibrillation: Secondary | ICD-10-CM

## 2021-11-07 NOTE — Telephone Encounter (Signed)
From pt

## 2021-11-07 NOTE — Addendum Note (Signed)
Addended by: Elder Negus on: 11/07/2021 08:54 AM   Modules accepted: Orders

## 2021-11-08 ENCOUNTER — Encounter: Payer: Self-pay | Admitting: Cardiology

## 2021-11-08 DIAGNOSIS — Z1231 Encounter for screening mammogram for malignant neoplasm of breast: Secondary | ICD-10-CM

## 2021-11-08 LAB — BRAIN NATRIURETIC PEPTIDE: BNP: 235.2 pg/mL — ABNORMAL HIGH (ref 0.0–100.0)

## 2021-11-08 MED ORDER — ENTRESTO 24-26 MG PO TABS: 1.0000 | ORAL_TABLET | Freq: Two times a day (BID) | ORAL | 2 refills | Status: DC

## 2021-11-08 NOTE — Progress Notes (Signed)
Spoke with the patient, discussed echocardiogram results.  Dyspnea has improved with Lasix 40 mg daily, but not completely resolved.  Given EF of 40%, I recommend addition of Entresto 24-26 mg twice daily.  Reduce Lasix to half tablet to none, depending on dyspnea, leg swelling, and any side effects of lightheadedness.  Encourage liberal hydration.  Repeat BMP in 1 week.  Continue wearing monitor.  Keep follow-up on 12/01/2021.

## 2021-11-09 NOTE — Telephone Encounter (Signed)
From pt

## 2021-11-13 ENCOUNTER — Other Ambulatory Visit: Payer: Self-pay | Admitting: Internal Medicine

## 2021-11-15 NOTE — Telephone Encounter (Signed)
From patient.

## 2021-11-16 NOTE — Telephone Encounter (Signed)
Spoke with the patient, All questions answered. She is having dyspnea symptoms and may need uptitration of medical therapy.   Staff, please schedule an appt in 3rd week Feb.  Thanks MJP

## 2021-11-16 NOTE — Telephone Encounter (Signed)
Pt has an appt 11/22/2021 at 11:15 am.

## 2021-11-20 ENCOUNTER — Encounter: Payer: Self-pay | Admitting: Cardiology

## 2021-11-21 NOTE — Telephone Encounter (Signed)
If she is taking 49/51 samples, then continue that, and take 2 pills of 24/26 bid. Ultimately, the goal would be to get her on the highest tolerated dose anyway.  Thanks MJP

## 2021-11-22 ENCOUNTER — Other Ambulatory Visit: Payer: Self-pay

## 2021-11-22 ENCOUNTER — Encounter: Payer: Self-pay | Admitting: Cardiology

## 2021-11-22 ENCOUNTER — Ambulatory Visit: Payer: Managed Care, Other (non HMO) | Admitting: Cardiology

## 2021-11-22 VITALS — BP 119/52 | HR 75 | Temp 98.5°F | Ht 66.0 in | Wt 137.0 lb

## 2021-11-22 DIAGNOSIS — I351 Nonrheumatic aortic (valve) insufficiency: Secondary | ICD-10-CM

## 2021-11-22 DIAGNOSIS — I480 Paroxysmal atrial fibrillation: Secondary | ICD-10-CM

## 2021-11-22 DIAGNOSIS — I5020 Unspecified systolic (congestive) heart failure: Secondary | ICD-10-CM

## 2021-11-22 DIAGNOSIS — R0609 Other forms of dyspnea: Secondary | ICD-10-CM

## 2021-11-22 NOTE — Progress Notes (Addendum)
Patient referred by Ginger Organ., MD for chest pain  Subjective:   Vanessa Sanchez, female    DOB: 1961/08/28, 61 y.o.   MRN: [id number]   Chief Complaint  Patient presents with   Atrial Fibrillation   Follow-up   Shortness of Breath   Numbness    HPI  61 y.o. Caucasian female with h/o ASD repair (1988), primary MR, TR, now s/p MVR, TVR and left intra-atrial CryoMaze via right thoracotomy by Dr Lunette Stands (09/2021)  Reviewed recent test and echocardiogram results with the patient, details below.  Patient feels worn out and has dyspnea with usual physical activity.  In spite of that, she is participating in cardiac rehab.  She denies any chest pain.   Current Outpatient Medications on File Prior to Visit  Medication Sig Dispense Refill   apixaban (ELIQUIS) 5 MG TABS tablet Take 1 tablet (5 mg total) by mouth 2 (two) times daily. 60 tablet 2   furosemide (LASIX) 40 MG tablet Take 1 tablet (40 mg total) by mouth daily. 30 tablet 3   metoprolol succinate (TOPROL-XL) 25 MG 24 hr tablet Take 12.5 mg by mouth 2 (two) times daily.     sacubitril-valsartan (ENTRESTO) 49-51 MG Take 1 tablet by mouth 2 (two) times daily.     amiodarone (PACERONE) 200 MG tablet Take 200 mg by mouth daily. (Patient not taking: Reported on 11/22/2021)     aspirin 81 MG EC tablet Take 1 tablet by mouth daily. (Patient not taking: Reported on 11/22/2021)     No current facility-administered medications on file prior to visit.    Cardiovascular studies:   EKG 11/23/2021: Sinus rhythm 75 bpm Anterolateral T wave inversion, consider iscehmia No significant change compared to previous EKG  EKG 10/30/2021: Sinus rhythm 72 bpm Anterolateral T wave inversion, consider ischemia  Echocardiogram 11/07/2021:  Left ventricle cavity is normal in size and wall thickness. Abnormal  septal wall motion s/p valve repair. Moderate global hypokinesis. LVEF  around 40%. Indeterminate diastolic filling pattern.  Left  atrial cavity is mildly dilated.  Structurally normal trileaflet aortic valve. No evidence of aortic  stenosis. Moderate (Grade II) aortic regurgitation.  S/P MVR. No mitral stenosis or regurgitation.  S/P TVR. No tricuspid stenosis or regurgitation.  Normal right atrial pressure.  Compared to previous study on 07/18/2021, LVEF is lower (previously noted  55% in presence of moderate to severe mitral regurgitation), MR TR now  absent post MVR, TVR. Moderate AI is new.   Op note 09/15/2021 (Dr. Lunette Stands) MVR, TVR and left intra-atrial CryoMaze via right thoracotomy   Coronary angiography 08/29/2021: Normal coronaries with no coronary artery disease Normal filling pressures   Well compensated mitral and tricuspid regurgitation Continue f/u Dr. Lunette Stands re: upcoming surgery.     TEE 08/03/2021:  1. Left ventricular ejection fraction, by estimation, is 55 to 60%. The  left ventricle has normal function.   2. Right ventricular systolic function is normal. The right ventricular  size is normal.   3. Spontaneous echo contrast seen in left atrium without LA/LAA thrombus.   4. 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 Two separate MR  jets, both moderate in severity.   5. Tricuspid valve regurgitation is moderate to severe.   6. The aortic valve is normal in structure. Aortic valve regurgitation is  not visualized. No aortic stenosis is present.   7. The left upper pulmonary vein, right upper pulmonary vein and right  lower pulmonary vein are abnormal  showing systolic blunting.   EKG 07/20/2021: Atrial fibrillation 123 bpm  Echocardiogram 07/20/2021:  Left ventricle cavity is normal in size and wall thickness. Normal global  wall motion. Normal LV systolic function with EF 55%. Indeterminate  diastolic filling pattern.  Left atrial cavity is severely dilated.  Moderate prolapse of the mitral valve leaflets. Moderate to severe mitral  regurgitation.  Moderate tricuspid  regurgitation. Estimated pulmonary artery systolic  pressure 38 mmHg.  Compared to previous study in 2021, tachycardia is new. Estimated PASP is  increased from 31 mmHg to 38 mmHg.   Treadmill Exercise Stress 07/29/2019: The patient exercised for 10:00 min on Bruce protocol; achieved 11.81 METs at 100% of maximum predicted heart rate.  The baseline blood pressure was 120/80 mmHg and increased to 180/70 mmHg at peak exercise. Chest pain is not present. Exercise was terminated due to fatigue/weakness. Resting EKG normal sinus rhythm with 1 mm inferolateral ST depression with T-wave inversion.  Stress EKG positive for myocardial ischemia, 3 mm ST segment depression with T-wave inversion in inferior and lateral leads persisting for >3 minutes into recovery.  Rare PVCs.  Holter monitor 07/08/2019: Dominant rhythm sinus.  Heart rate 43-143 bpm.  Average heart rate 66 bpm. Less than 1% supraventricular ectopy 2.8% ventricular ectopy.  No nonsustained or sustained ventricular tachycardia. No other arrhythmias. No symptoms reported.  Recent labs:   Latest Reference Range & Units 10/30/21 12:50 11/07/21 11:58  BNP 0.0 - 100.0 pg/mL 1,235.2 (H) 235.2 (H)  (H): Data is abnormally high  09/21/2021: Glucose 89, BUN/Cr 11/0.57. EGFR >90. Na/K 138/4.3. Rest of the CMP normal H/H 10/29. MCV 94. Platelets 266 Chol 140, TG 154, HDL 50, LDL 60  07/07/2019: D-dimer 0.22 mg/L  09/09/2018: H/H 14/43.  MCV 102.  Platelets 213. Glucose 83.  BUN/creatinine 15/0.8.  EGFR 73.  Sodium 144, potassium 4.6.  Rest of the CMP normal. Cholesterol 237, triglycerides 90, HDL 82, LDL 137. TSH normal.   Review of Systems  Cardiovascular:  Positive for dyspnea on exertion and palpitations. Negative for chest pain, leg swelling and syncope.       Vitals:   11/22/21 1112  BP: (!) 119/52  Pulse: 75  Temp: 98.5 F (36.9 C)  SpO2: 100%      Objective:   Physical Exam Vitals and nursing note reviewed.   Constitutional:      General: She is not in acute distress. Neck:     Vascular: No JVD.  Cardiovascular:     Rate and Rhythm: Normal rate and regular rhythm.     Heart sounds: Normal heart sounds. No murmur heard. Pulmonary:     Effort: Pulmonary effort is normal.     Breath sounds: Normal breath sounds. No wheezing or rales.  Musculoskeletal:     Right lower leg: No edema.     Left lower leg: No edema.        Assessment & Recommendations:   61 y.o. Caucasian female with h/o ASD repair (1988), primary MR, TR, now s/p MVR, TVR and left intra-atrial CryoMaze via right thoracotomy by Dr Lunette Stands (09/2021)  HFrEF: Global hypokinesis with EF 40% after mitral and tricuspid annuloplasty. I reckon her EF was falsely elevated in presence of severe MR which is now resolved. Recommend guideline directed likely nonischemic cardiomyopathy in the setting of prior MR, TR. Recommend guideline directed medical therapy. Currently on Entresto 49-51 mg twice daily, metoprolol succinate 25 mg daily, Lasix 20 mg daily. We will check BMP, proBNP today.  Based  on the results, will consider adding spironolactone.  Aortic regurgitation: New finding after mitral annuloplasty.  I am not entirely sure the origin of her aortic regurgitation, possibly related to anatomy changes postsurgery. I do not think moderate aortic regurgitation is responsible for her current symptoms and low EF. We will repeat echocardiogram in 3 months.  Abnormal EKG: Anterolateral T wave inversions seen on EKG without any complaints of chest pain.  Normal coronaries on recent angiogram. We will monitor for now.  PAF: Currently in sinus rhythm CHA2DS2VAsc score is 1, with annual stroke risk of 0.6%.  If cardiac telemetry reveals no A-fib, I think is reasonable to come off Eliquis.   She is already stopped amiodarone.   When she stops Eliquis, could consider aspirin 81 mg daily.  H/o ASD repair: Intact by recent echocardiogram  07/2019.  Patient remains quite symptomatic status post mitral and tricuspid annuloplasty and now uncovered HFrEF.  She will need longer period for recovery and disability.  I will continue to assist this in her upcoming Appointments with me.  F/u in 2 weeks  Nigel Mormon, MD [facility] [place] Pager: [phone] Office: [phone] If no answer Cell [phone]  Addendum: Reviewed labs from 11/22/2021.  proBNP only mildly elevated.  Potassium 5.2.  In this setting, I will skip spironolactone, and instead add Farxiga 10 mg daily to substitute daily Lasix use.  She may still use Lasix for as needed purpose.     Nigel Mormon, MD Pager: [phone] Office: [phone]

## 2021-11-23 ENCOUNTER — Encounter: Payer: Self-pay | Admitting: Cardiology

## 2021-11-23 DIAGNOSIS — I5032 Chronic diastolic (congestive) heart failure: Secondary | ICD-10-CM | POA: Insufficient documentation

## 2021-11-23 DIAGNOSIS — I5020 Unspecified systolic (congestive) heart failure: Secondary | ICD-10-CM | POA: Insufficient documentation

## 2021-11-23 DIAGNOSIS — I351 Nonrheumatic aortic (valve) insufficiency: Secondary | ICD-10-CM | POA: Insufficient documentation

## 2021-11-23 LAB — PRO B NATRIURETIC PEPTIDE: NT-Pro BNP: 403 pg/mL — ABNORMAL HIGH (ref 0–287)

## 2021-11-23 LAB — BASIC METABOLIC PANEL
BUN/Creatinine Ratio: 14 (ref 12–28)
BUN: 12 mg/dL (ref 8–27)
CO2: 24 mmol/L (ref 20–29)
Calcium: 9.6 mg/dL (ref 8.7–10.3)
Chloride: 102 mmol/L (ref 96–106)
Creatinine, Ser: 0.84 mg/dL (ref 0.57–1.00)
Glucose: 99 mg/dL (ref 70–99)
Potassium: 5.2 mmol/L (ref 3.5–5.2)
Sodium: 140 mmol/L (ref 134–144)
eGFR: 79 mL/min/{1.73_m2} (ref >59)

## 2021-11-23 MED ORDER — DAPAGLIFLOZIN PROPANEDIOL 10 MG PO TABS: 10.0000 mg | ORAL_TABLET | Freq: Every day | ORAL | 2 refills | Status: DC

## 2021-11-23 MED ORDER — FUROSEMIDE 40 MG PO TABS: 40.0000 mg | ORAL_TABLET | ORAL | 0 refills | Status: DC | PRN

## 2021-11-23 NOTE — Addendum Note (Signed)
Addended by: Elder Negus on: 11/23/2021 04:48 PM   Modules accepted: Orders, Level of Service

## 2021-11-30 ENCOUNTER — Ambulatory Visit: Payer: Managed Care, Other (non HMO) | Admitting: Cardiology

## 2021-12-01 ENCOUNTER — Ambulatory Visit: Payer: Managed Care, Other (non HMO) | Admitting: Cardiology

## 2021-12-12 ENCOUNTER — Ambulatory Visit
Admission: RE | Admit: 2021-12-12 | Discharge: 2021-12-12 | Disposition: A | Discharge status: Home / Self Care | Payer: Managed Care, Other (non HMO) | Source: Ambulatory Visit | Attending: Obstetrics and Gynecology | Admitting: Obstetrics and Gynecology

## 2021-12-12 DIAGNOSIS — Z1231 Encounter for screening mammogram for malignant neoplasm of breast: Secondary | ICD-10-CM

## 2021-12-13 ENCOUNTER — Ambulatory Visit
Admission: RE | Admit: 2021-12-13 | Discharge: 2021-12-13 | Disposition: A | Discharge status: Home / Self Care | Payer: Managed Care, Other (non HMO) | Source: Ambulatory Visit | Attending: Internal Medicine | Admitting: Internal Medicine

## 2021-12-13 ENCOUNTER — Other Ambulatory Visit: Payer: Self-pay

## 2021-12-13 DIAGNOSIS — F17201 Nicotine dependence, unspecified, in remission: Secondary | ICD-10-CM

## 2021-12-14 ENCOUNTER — Ambulatory Visit: Payer: Managed Care, Other (non HMO) | Admitting: Cardiology

## 2021-12-14 ENCOUNTER — Encounter: Payer: Self-pay | Admitting: Cardiology

## 2021-12-14 VITALS — BP 97/56 | HR 68 | Temp 98.6°F | Resp 16 | Ht 66.0 in | Wt 137.8 lb

## 2021-12-14 DIAGNOSIS — I480 Paroxysmal atrial fibrillation: Secondary | ICD-10-CM

## 2021-12-14 DIAGNOSIS — I5020 Unspecified systolic (congestive) heart failure: Secondary | ICD-10-CM

## 2021-12-14 DIAGNOSIS — I351 Nonrheumatic aortic (valve) insufficiency: Secondary | ICD-10-CM

## 2021-12-14 DIAGNOSIS — R0609 Other forms of dyspnea: Secondary | ICD-10-CM

## 2021-12-14 MED ORDER — FUROSEMIDE 20 MG PO TABS: 20.0000 mg | ORAL_TABLET | ORAL | 2 refills | Status: DC

## 2021-12-14 MED ORDER — ASPIRIN EC 81 MG PO TBEC: 81.0000 mg | DELAYED_RELEASE_TABLET | Freq: Every day | ORAL | 3 refills | Status: AC

## 2021-12-14 NOTE — Progress Notes (Signed)
Patient referred by Ginger Organ., MD for chest pain  Subjective:   Vanessa Sanchez, female    DOB: Oct 17, 1960, 61 y.o.   MRN: [id number]   Chief Complaint  Patient presents with   Atrial Fibrillation   Follow-up    4 week   Dizziness    HPI  61 y.o. Caucasian female with h/o ASD repair (1988), primary MR, TR, now s/p MVR, TVR and left intra-atrial CryoMaze via right thoracotomy by Dr Lunette Stands (09/2021)  Patient continues to have exertional dyspnea symptoms limiting her day-to-day activity.  Due to her symptoms, she has decided to stop her job and apply for long-term disability.  She denies any leg edema, has occasional lightheadedness, but denies any syncope symptoms.  Current Outpatient Medications on File Prior to Visit  Medication Sig Dispense Refill   apixaban (ELIQUIS) 5 MG TABS tablet Take 1 tablet (5 mg total) by mouth 2 (two) times daily. 60 tablet 2   aspirin 81 MG EC tablet Take 1 tablet by mouth daily. (Patient not taking: Reported on 11/22/2021)     dapagliflozin propanediol (FARXIGA) 10 MG TABS tablet Take 1 tablet (10 mg total) by mouth daily before breakfast. 30 tablet 2   furosemide (LASIX) 40 MG tablet Take 1 tablet (40 mg total) by mouth as needed. 1 tablet 0   metoprolol succinate (TOPROL-XL) 25 MG 24 hr tablet Take 12.5 mg by mouth 2 (two) times daily.     sacubitril-valsartan (ENTRESTO) 49-51 MG Take 1 tablet by mouth 2 (two) times daily.     No current facility-administered medications on file prior to visit.    Cardiovascular studies:   Mobile cardiac telemetry 14 days 11/07/2021 - 11/21/2021: Dominant rhythm: Sinus. HR 59-103 bpm. Avg HR 73 bpm, in sinus rhythm. 2 episodes of SVT, fastest at 125 bpm for 4 beats, longest for 10 beats at 90 bpm. <% isolated SVE, couplet/triplets. 0 episodes of V. <1% isolated VE, couplets. No atrial fibrillation/atrial flutter/VT/high grade AV block, sinus pause >3sec noted. 1 patient triggered event, correlated  with artifact.  EKG 11/23/2021: Sinus rhythm 75 bpm Anterolateral T wave inversion, consider iscehmia No significant change compared to previous EKG  Echocardiogram 11/07/2021:  Left ventricle cavity is normal in size and wall thickness. Abnormal  septal wall motion s/p valve repair. Moderate global hypokinesis. LVEF  around 40%. Indeterminate diastolic filling pattern.  Left atrial cavity is mildly dilated.  Structurally normal trileaflet aortic valve. No evidence of aortic  stenosis. Moderate (Grade II) aortic regurgitation.  S/P MVR. No mitral stenosis or regurgitation.  S/P TVR. No tricuspid stenosis or regurgitation.  Normal right atrial pressure.  Compared to previous study on 07/18/2021, LVEF is lower (previously noted  55% in presence of moderate to severe mitral regurgitation), MR TR now  absent post MVR, TVR. Moderate AI is new.   Op note 09/15/2021 (Dr. Lunette Stands) MVR, TVR and left intra-atrial CryoMaze via right thoracotomy   Coronary angiography 08/29/2021: Normal coronaries with no coronary artery disease Normal filling pressures   Well compensated mitral and tricuspid regurgitation Continue f/u Dr. Lunette Stands re: upcoming surgery.     TEE 08/03/2021:  1. Left ventricular ejection fraction, by estimation, is 55 to 60%. The  left ventricle has normal function.   2. Right ventricular systolic function is normal. The right ventricular  size is normal.   3. Spontaneous echo contrast seen in left atrium without LA/LAA thrombus.   4. 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 Two  separate MR  jets, both moderate in severity.   5. Tricuspid valve regurgitation is moderate to severe.   6. The aortic valve is normal in structure. Aortic valve regurgitation is  not visualized. No aortic stenosis is present.   7. The left upper pulmonary vein, right upper pulmonary vein and right  lower pulmonary vein are abnormal showing systolic blunting.   EKG 07/20/2021: Atrial  fibrillation 123 bpm  Echocardiogram 07/20/2021:  Left ventricle cavity is normal in size and wall thickness. Normal global  wall motion. Normal LV systolic function with EF 55%. Indeterminate  diastolic filling pattern.  Left atrial cavity is severely dilated.  Moderate prolapse of the mitral valve leaflets. Moderate to severe mitral  regurgitation.  Moderate tricuspid regurgitation. Estimated pulmonary artery systolic  pressure 38 mmHg.  Compared to previous study in 2021, tachycardia is new. Estimated PASP is  increased from 31 mmHg to 38 mmHg.   Treadmill Exercise Stress 07/29/2019: The patient exercised for 10:00 min on Bruce protocol; achieved 11.81 METs at 100% of maximum predicted heart rate.  The baseline blood pressure was 120/80 mmHg and increased to 180/70 mmHg at peak exercise. Chest pain is not present. Exercise was terminated due to fatigue/weakness. Resting EKG normal sinus rhythm with 1 mm inferolateral ST depression with T-wave inversion.  Stress EKG positive for myocardial ischemia, 3 mm ST segment depression with T-wave inversion in inferior and lateral leads persisting for >3 minutes into recovery.  Rare PVCs.  Holter monitor 07/08/2019: Dominant rhythm sinus.  Heart rate 43-143 bpm.  Average heart rate 66 bpm. Less than 1% supraventricular ectopy 2.8% ventricular ectopy.  No nonsustained or sustained ventricular tachycardia. No other arrhythmias. No symptoms reported.  Recent labs: 11/22/2021: Glucose 99, BUN/Cr 12/0.84. EGFR 79. Na/K 140/5.2.  NT pro BNP 403    Latest Reference Range & Units 10/30/21 12:50 11/07/21 11:58  BNP 0.0 - 100.0 pg/mL 1,235.2 (H) 235.2 (H)  (H): Data is abnormally high  09/21/2021: Glucose 89, BUN/Cr 11/0.57. EGFR >90. Na/K 138/4.3. Rest of the CMP normal H/H 10/29. MCV 94. Platelets 266 Chol 140, TG 154, HDL 50, LDL 60  07/07/2019: D-dimer 0.22 mg/L  09/09/2018: H/H 14/43.  MCV 102.  Platelets 213. Glucose 83.  BUN/creatinine  15/0.8.  EGFR 73.  Sodium 144, potassium 4.6.  Rest of the CMP normal. Cholesterol 237, triglycerides 90, HDL 82, LDL 137. TSH normal.   Review of Systems  Cardiovascular:  Positive for dyspnea on exertion and palpitations. Negative for chest pain, leg swelling and syncope.       Vitals:   12/14/21 0954 12/14/21 0955  BP: (!) 103/56 (!) 97/56  Pulse: 66 68  Resp:    Temp:    SpO2: 100% 99%      Objective:   Physical Exam Vitals and nursing note reviewed.  Constitutional:      General: She is not in acute distress. Neck:     Vascular: No JVD.  Cardiovascular:     Rate and Rhythm: Normal rate and regular rhythm.     Heart sounds: Normal heart sounds. No murmur heard. Pulmonary:     Effort: Pulmonary effort is normal.     Breath sounds: Normal breath sounds. No wheezing or rales.  Musculoskeletal:     Right lower leg: No edema.     Left lower leg: No edema.        Assessment & Recommendations:   61 y.o. Caucasian female with h/o ASD repair (1988), primary MR, TR, now s/p  MVR, TVR and left intra-atrial CryoMaze via right thoracotomy by Dr Lunette Stands (09/2021), now with worsening exertional dyspnea  Dyspnea on exertion:  Global hypokinesis with EF 40% after mitral and tricuspid annuloplasty. 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  While her EF dropped as expected after mitral tracks from repair, she was noted to have a new moderate aortic regurgitation.  Symptoms have persisted in spite of guideline directed heart failure therapy. Currently on Entresto 49-51 mg twice daily, metoprolol succinate 25 mg daily, Farxiga 10 mg daily.   Added Lasix 20 mg every other day.    I remain quite perplexed about her profound dyspnea after the surgery.  I am unsure to what extent this new moderate aortic regurgitation could be contributing to her symptoms.   I initially suggested repeat transthoracic echocardiogram in a month time.  Further, I even discussed TEE and right and left heart catheterization with the patient.  She is unsure about the latter.  For now, we will await her decision, regardless performed transthoracic echocardiogram in April 2023.  I will complete her long-term disability paperwork.  PAF: Currently in sinus rhythm No recurrent Afib on cardiac telemetry. CHA2DS2VAsc score is 1, with annual stroke risk of 0.6%.  Okay to stop Eliquis.   She has already stopped amiodarone.   Recommend aspirin 81 mg daily for now.  H/o ASD repair: Intact by recent echocardiogram 07/2019.  F/u in 4 weeks  [place], MD [facility] [place] Pager: [phone] Office: [phone] If no answer Cell [phone]  Addendum: Reviewed labs from 11/22/2021.  proBNP only mildly elevated.  Potassium 5.2.  In this setting, I will skip spironolactone, and instead add Farxiga 10 mg daily to substitute daily Lasix use.  She may still use Lasix for as needed purpose.     Nigel Mormon, MD Pager: [phone] Office: [phone]

## 2021-12-25 ENCOUNTER — Other Ambulatory Visit: Payer: Self-pay

## 2021-12-25 MED ORDER — SACUBITRIL-VALSARTAN 49-51 MG PO TABS: 1.0000 | ORAL_TABLET | Freq: Two times a day (BID) | ORAL | 3 refills | Status: DC

## 2021-12-25 MED ORDER — SACUBITRIL-VALSARTAN 49-51 MG PO TABS
1.0000 | ORAL_TABLET | Freq: Two times a day (BID) | ORAL | 3 refills | Status: DC
Start: 2021-12-25 — End: 2021-12-25

## 2021-12-25 NOTE — Telephone Encounter (Signed)
From pt

## 2021-12-26 ENCOUNTER — Other Ambulatory Visit: Payer: Self-pay | Admitting: Cardiology

## 2021-12-26 DIAGNOSIS — I5020 Unspecified systolic (congestive) heart failure: Secondary | ICD-10-CM

## 2021-12-26 MED ORDER — SACUBITRIL-VALSARTAN 49-51 MG PO TABS: 1.0000 | ORAL_TABLET | Freq: Two times a day (BID) | ORAL | 3 refills | Status: DC

## 2022-01-15 ENCOUNTER — Ambulatory Visit: Payer: Managed Care, Other (non HMO)

## 2022-01-15 DIAGNOSIS — I5020 Unspecified systolic (congestive) heart failure: Secondary | ICD-10-CM

## 2022-01-17 NOTE — Progress Notes (Signed)
Heart function has improved. I don't see any follow up scheduled. Can see for non urgent f/u at patients convenience. ? ?Thanks ?MJP ? ?

## 2022-01-17 NOTE — Progress Notes (Signed)
Called pt no answer, left a vm

## 2022-01-19 NOTE — Progress Notes (Signed)
Called pt no answer, left a vm

## 2022-01-24 NOTE — Progress Notes (Signed)
Called and spoke to pt, pt voiced understanding. However she would like to speak to you regarding the aortic regurgitation.

## 2022-01-31 ENCOUNTER — Encounter: Payer: Self-pay | Admitting: Cardiology

## 2022-01-31 NOTE — Progress Notes (Signed)
Will try calling in next day or two. ? ?Thanks ?MJP ? ?

## 2022-01-31 NOTE — Telephone Encounter (Signed)
From patient.

## 2022-01-31 NOTE — Telephone Encounter (Signed)
DO we have any pending documents I need to sign for her? Have we sent all the requested documents? ? ?Thanks ?MJP ?

## 2022-02-01 ENCOUNTER — Encounter: Payer: Self-pay | Admitting: Cardiology

## 2022-02-01 NOTE — Telephone Encounter (Signed)
From patient.

## 2022-02-02 NOTE — Progress Notes (Signed)
Called the patient and left a voicemail.

## 2022-02-05 ENCOUNTER — Other Ambulatory Visit: Payer: Self-pay | Admitting: Cardiology

## 2022-02-05 DIAGNOSIS — I5020 Unspecified systolic (congestive) heart failure: Secondary | ICD-10-CM

## 2022-02-08 LAB — BASIC METABOLIC PANEL
BUN/Creatinine Ratio: 19 (ref 12–28)
BUN: 13 mg/dL (ref 8–27)
CO2: 24 mmol/L (ref 20–29)
Calcium: 9.5 mg/dL (ref 8.7–10.3)
Chloride: 105 mmol/L (ref 96–106)
Creatinine, Ser: 0.7 mg/dL (ref 0.57–1.00)
Glucose: 76 mg/dL (ref 70–99)
Potassium: 5.5 mmol/L — ABNORMAL HIGH (ref 3.5–5.2)
Sodium: 143 mmol/L (ref 134–144)
eGFR: 98 mL/min/{1.73_m2} (ref >59)

## 2022-02-09 ENCOUNTER — Other Ambulatory Visit: Payer: Self-pay

## 2022-02-09 ENCOUNTER — Other Ambulatory Visit: Payer: Self-pay | Admitting: Cardiology

## 2022-02-09 MED ORDER — METOPROLOL SUCCINATE ER 25 MG PO TB24: 12.5000 mg | ORAL_TABLET | Freq: Two times a day (BID) | ORAL | 3 refills | Status: DC

## 2022-02-12 ENCOUNTER — Other Ambulatory Visit: Payer: Self-pay

## 2022-02-12 DIAGNOSIS — I5020 Unspecified systolic (congestive) heart failure: Secondary | ICD-10-CM

## 2022-02-12 NOTE — Progress Notes (Signed)
Potassium is upper limit normal. ?Avoid high potassium foods like banana. ?Take lasix 20 mg daily for next 3 days. ?Please order repeat labs for next week. ? ?Thanks ?MJP ? ?

## 2022-02-12 NOTE — Progress Notes (Signed)
Called pt to inform her about her lab result. Pt understood and reorder her BMP

## 2022-02-19 ENCOUNTER — Other Ambulatory Visit: Payer: Self-pay

## 2022-02-19 DIAGNOSIS — I5020 Unspecified systolic (congestive) heart failure: Secondary | ICD-10-CM

## 2022-02-19 MED ORDER — DAPAGLIFLOZIN PROPANEDIOL 10 MG PO TABS: 10.0000 mg | ORAL_TABLET | Freq: Every day | ORAL | 2 refills | Status: DC

## 2022-02-19 MED ORDER — SACUBITRIL-VALSARTAN 49-51 MG PO TABS: 1.0000 | ORAL_TABLET | Freq: Two times a day (BID) | ORAL | 3 refills | Status: DC

## 2022-02-21 LAB — BMP8+EGFR
BUN/Creatinine Ratio: 16 (ref 12–28)
BUN: 14 mg/dL (ref 8–27)
CO2: 24 mmol/L (ref 20–29)
Calcium: 9.3 mg/dL (ref 8.7–10.3)
Chloride: 101 mmol/L (ref 96–106)
Creatinine, Ser: 0.86 mg/dL (ref 0.57–1.00)
Glucose: 77 mg/dL (ref 70–99)
Potassium: 5.1 mmol/L (ref 3.5–5.2)
Sodium: 140 mmol/L (ref 134–144)
eGFR: 77 mL/min/{1.73_m2} (ref >59)

## 2022-03-28 ENCOUNTER — Telehealth: Payer: Self-pay | Admitting: Cardiology

## 2022-03-28 NOTE — Telephone Encounter (Signed)
Patient calling to get an update on the paperwork you have for her. I believe it's in regards to disability paperwork. Please let myself or Felicia know when you have an update on this. Thank you.

## 2022-04-20 ENCOUNTER — Encounter: Payer: Self-pay | Admitting: Cardiology

## 2022-04-20 NOTE — Telephone Encounter (Signed)
From patient.

## 2022-04-25 DIAGNOSIS — H40013 Open angle with borderline findings, low risk, bilateral: Secondary | ICD-10-CM | POA: Diagnosis not present

## 2022-04-30 NOTE — Telephone Encounter (Signed)
From pt

## 2022-05-02 NOTE — Telephone Encounter (Signed)
Please arrange appt in August.  Please let her know I will call her 7/27 ot 7/28 when I'm back in office.  Thanks MJP

## 2022-05-03 NOTE — Telephone Encounter (Signed)
From patient.

## 2022-05-18 ENCOUNTER — Ambulatory Visit: Payer: 59 | Admitting: Cardiology

## 2022-05-23 ENCOUNTER — Ambulatory Visit: Payer: 59 | Admitting: Cardiology

## 2022-05-23 ENCOUNTER — Encounter: Payer: Self-pay | Admitting: Cardiology

## 2022-05-23 VITALS — BP 111/58 | HR 65 | Resp 16 | Ht 66.0 in | Wt 136.0 lb

## 2022-05-23 DIAGNOSIS — I5020 Unspecified systolic (congestive) heart failure: Secondary | ICD-10-CM | POA: Diagnosis not present

## 2022-05-23 DIAGNOSIS — I480 Paroxysmal atrial fibrillation: Secondary | ICD-10-CM

## 2022-05-23 DIAGNOSIS — R0609 Other forms of dyspnea: Secondary | ICD-10-CM

## 2022-05-23 NOTE — Progress Notes (Signed)
Patient referred by Ginger Organ., MD for chest pain  Subjective:   Vanessa Sanchez, female    DOB: 1961-04-29, 61 y.o.   MRN: [id number]   Chief Complaint  Patient presents with   heart failure with reduced ejection fraction   Follow-up    HPI  61 y.o. Caucasian female with h/o ASD repair (1988), primary MR, TR, now s/p MVR, TVR and left intra-atrial CryoMaze via right thoracotomy by Dr Lunette Stands (09/2021)  Patient is here for 62-monthfollow-up.  She continues to have fatigue and exertional dyspnea, although mildly improved compared to spring 2023.   Current Outpatient Medications:    aspirin EC 81 MG tablet, Take 1 tablet (81 mg total) by mouth daily., Disp: 30 tablet, Rfl: 3   dapagliflozin propanediol (FARXIGA) 10 MG TABS tablet, Take 1 tablet (10 mg total) by mouth daily before breakfast., Disp: 90 tablet, Rfl: 2   furosemide (LASIX) 20 MG tablet, Take 1 tablet (20 mg total) by mouth every other day., Disp: 60 tablet, Rfl: 2   metoprolol succinate (TOPROL-XL) 25 MG 24 hr tablet, Take 0.5 tablets (12.5 mg total) by mouth daily., Disp: 30 tablet, Rfl: 3   sacubitril-valsartan (ENTRESTO) 49-51 MG, Take 1 tablet by mouth 2 (two) times daily., Disp: 180 tablet, Rfl: 3    Cardiovascular studies:   EKG 05/23/2022: Sinus rhythm 65 bpm Low voltage Nonspecific ST-T abnormality  Echocardiogram 01/15/2022:  Left ventricle cavity is normal in size and wall thickness. Normal global  wall motion. Normal LV systolic function with visual EF 50-55%. Doppler  evidence of grade II (pseudonormal) diastolic dysfunction, normal LAP.  Left atrial cavity is mildly dilated.  Structurally normal trileaflet aortic valve.  Mild to moderate aortic  regurgitation.  S/P MVR. No mitral stenosis or regurgitation.  S/P TVR. No tricuspid stenosis or regurgitation.  Compared to previous study on 11/07/2021, LVEF has improved from 40%.  Aortic regurgitation is marginally improved from moderate (grade  II).  Mobile cardiac telemetry 14 days 11/07/2021 - 11/21/2021: Dominant rhythm: Sinus. HR 59-103 bpm. Avg HR 73 bpm, in sinus rhythm. 2 episodes of SVT, fastest at 125 bpm for 4 beats, longest for 10 beats at 90 bpm. <% isolated SVE, couplet/triplets. 0 episodes of V. <1% isolated VE, couplets. No atrial fibrillation/atrial flutter/VT/high grade AV block, sinus pause >3sec noted. 1 patient triggered event, correlated with artifact.  Echocardiogram 11/07/2021:  Left ventricle cavity is normal in size and wall thickness. Abnormal  septal wall motion s/p valve repair. Moderate global hypokinesis. LVEF  around 40%. Indeterminate diastolic filling pattern.  Left atrial cavity is mildly dilated.  Structurally normal trileaflet aortic valve. No evidence of aortic  stenosis. Moderate (Grade II) aortic regurgitation.  S/P MVR. No mitral stenosis or regurgitation.  S/P TVR. No tricuspid stenosis or regurgitation.  Normal right atrial pressure.  Compared to previous study on 07/18/2021, LVEF is lower (previously noted  55% in presence of moderate to severe mitral regurgitation), MR TR now  absent post MVR, TVR. Moderate AI is new.   Op note 09/15/2021 (Dr. KLunette Stands MVR, TVR and left intra-atrial CryoMaze via right thoracotomy   Coronary angiography 08/29/2021: Normal coronaries with no coronary artery disease Normal filling pressures   Well compensated mitral and tricuspid regurgitation Continue f/u Dr. KLunette Standsre: upcoming surgery.     TEE 08/03/2021:  1. Left ventricular ejection fraction, by estimation, is 55 to 60%. The  left ventricle has normal function.   2. Right ventricular systolic function is  normal. The right ventricular  size is normal.   3. Spontaneous echo contrast seen in left atrium without LA/LAA thrombus.   4. 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 Two separate MR  jets, both moderate in severity.   5. Tricuspid valve regurgitation is moderate to  severe.   6. The aortic valve is normal in structure. Aortic valve regurgitation is  not visualized. No aortic stenosis is present.   7. The left upper pulmonary vein, right upper pulmonary vein and right  lower pulmonary vein are abnormal showing systolic blunting.   Echocardiogram 07/20/2021:  Left ventricle cavity is normal in size and wall thickness. Normal global  wall motion. Normal LV systolic function with EF 55%. Indeterminate  diastolic filling pattern.  Left atrial cavity is severely dilated.  Moderate prolapse of the mitral valve leaflets. Moderate to severe mitral  regurgitation.  Moderate tricuspid regurgitation. Estimated pulmonary artery systolic  pressure 38 mmHg.  Compared to previous study in 2021, tachycardia is new. Estimated PASP is  increased from 31 mmHg to 38 mmHg.   Treadmill Exercise Stress 07/29/2019: The patient exercised for 10:00 min on Bruce protocol; achieved 11.81 METs at 100% of maximum predicted heart rate.  The baseline blood pressure was 120/80 mmHg and increased to 180/70 mmHg at peak exercise. Chest pain is not present. Exercise was terminated due to fatigue/weakness. Resting EKG normal sinus rhythm with 1 mm inferolateral ST depression with T-wave inversion.  Stress EKG positive for myocardial ischemia, 3 mm ST segment depression with T-wave inversion in inferior and lateral leads persisting for >3 minutes into recovery.  Rare PVCs.  Holter monitor 07/08/2019: Dominant rhythm sinus.  Heart rate 43-143 bpm.  Average heart rate 66 bpm. Less than 1% supraventricular ectopy 2.8% ventricular ectopy.  No nonsustained or sustained ventricular tachycardia. No other arrhythmias. No symptoms reported.  Recent labs: 11/22/2021: Glucose 99, BUN/Cr 12/0.84. EGFR 79. Na/K 140/5.2.  NT pro BNP 403    Latest Reference Range & Units 10/30/21 12:50 11/07/21 11:58  BNP 0.0 - 100.0 pg/mL 1,235.2 (H) 235.2 (H)  (H): Data is abnormally  high  09/21/2021: Glucose 89, BUN/Cr 11/0.57. EGFR >90. Na/K 138/4.3. Rest of the CMP normal H/H 10/29. MCV 94. Platelets 266 Chol 140, TG 154, HDL 50, LDL 60  07/07/2019: D-dimer 0.22 mg/L  09/09/2018: H/H 14/43.  MCV 102.  Platelets 213. Glucose 83.  BUN/creatinine 15/0.8.  EGFR 73.  Sodium 144, potassium 4.6.  Rest of the CMP normal. Cholesterol 237, triglycerides 90, HDL 82, LDL 137. TSH normal.   Review of Systems  Cardiovascular:  Positive for dyspnea on exertion. Negative for chest pain, leg swelling, palpitations and syncope.        Vitals:   05/23/22 1240  BP: (!) 111/58  Pulse: 65  Resp: 16  SpO2: 98%      Objective:   Physical Exam Vitals and nursing note reviewed.  Constitutional:      General: She is not in acute distress. Neck:     Vascular: No JVD.  Cardiovascular:     Rate and Rhythm: Normal rate and regular rhythm.     Heart sounds: Normal heart sounds. No murmur heard. Pulmonary:     Effort: Pulmonary effort is normal.     Breath sounds: Normal breath sounds. No wheezing or rales.  Musculoskeletal:     Right lower leg: No edema.     Left lower leg: No edema.         Assessment & Recommendations:  61 y.o. Caucasian female with h/o ASD repair (1988), primary MR, TR, now s/p MVR, TVR and left intra-atrial CryoMaze via right thoracotomy by Dr Lunette Stands (09/2021), now with worsening exertional dyspnea  Dyspnea on exertion:  Persist in spite of improvement in EF.   Since mitral and tricuspid valve repair, there is no significant residual MR or TR.  There is a, but is no more than moderate.  I remain on the perplexed that she continues to have dyspnea symptoms. I discussed performing TEE for better evaluation of her AI, but she would like to avoid this if possible. For now, I recommend cardiopulmonary exercise stress testing to further elucidate cause of her dyspnea. Continue Entresto 49-51 mg twice daily, metoprolol succinate 25 mg daily,  Farxiga 10 mg daily.   Check labs today.  PAF: Currently in sinus rhythm No recurrent Afib on cardiac telemetry. CHA2DS2VAsc score is 1, with annual stroke risk of 0.6%.  Therefore, not on Eliquis anymore.   Continue aspirin 81 mg daily.    H/o ASD repair: Intact by recent echocardiogram 07/2019.  F/u in 3 months  [place], MD [facility]. [place] Pager: [phone] Office: [phone] If no answer Cell [phone]  Addendum: Reviewed labs from 11/22/2021.  proBNP only mildly elevated.  Potassium 5.2.  In this setting, I will skip spironolactone, and instead add Farxiga 10 mg daily to substitute daily Lasix use.  She may still use Lasix for as needed purpose.     Nigel Mormon, MD Pager: [phone] Office: [phone]

## 2022-05-24 DIAGNOSIS — I5020 Unspecified systolic (congestive) heart failure: Secondary | ICD-10-CM | POA: Diagnosis not present

## 2022-05-24 DIAGNOSIS — R0609 Other forms of dyspnea: Secondary | ICD-10-CM | POA: Diagnosis not present

## 2022-05-25 ENCOUNTER — Encounter: Payer: Self-pay | Admitting: Cardiology

## 2022-05-25 LAB — BASIC METABOLIC PANEL
BUN/Creatinine Ratio: 20 (ref 12–28)
BUN: 16 mg/dL (ref 8–27)
CO2: 24 mmol/L (ref 20–29)
Calcium: 9.7 mg/dL (ref 8.7–10.3)
Chloride: 105 mmol/L (ref 96–106)
Creatinine, Ser: 0.79 mg/dL (ref 0.57–1.00)
Glucose: 87 mg/dL (ref 70–99)
Potassium: 4.5 mmol/L (ref 3.5–5.2)
Sodium: 144 mmol/L (ref 134–144)
eGFR: 85 mL/min/{1.73_m2} (ref >59)

## 2022-05-25 LAB — BRAIN NATRIURETIC PEPTIDE: BNP: 73.1 pg/mL (ref 0.0–100.0)

## 2022-06-14 ENCOUNTER — Encounter: Payer: Self-pay | Admitting: Cardiology

## 2022-06-15 ENCOUNTER — Other Ambulatory Visit: Payer: Self-pay | Admitting: Cardiology

## 2022-06-15 DIAGNOSIS — I351 Nonrheumatic aortic (valve) insufficiency: Secondary | ICD-10-CM

## 2022-06-15 NOTE — Telephone Encounter (Signed)
From patient.

## 2022-06-15 NOTE — Telephone Encounter (Signed)
From pt

## 2022-06-15 NOTE — Telephone Encounter (Signed)
Needs an echo schedule please.

## 2022-06-19 ENCOUNTER — Telehealth: Payer: Self-pay | Admitting: Cardiology

## 2022-06-19 ENCOUNTER — Other Ambulatory Visit: Payer: Self-pay

## 2022-06-19 DIAGNOSIS — I5020 Unspecified systolic (congestive) heart failure: Secondary | ICD-10-CM

## 2022-06-19 MED ORDER — DAPAGLIFLOZIN PROPANEDIOL 10 MG PO TABS: 10.0000 mg | ORAL_TABLET | Freq: Every day | ORAL | 3 refills | Status: DC

## 2022-06-19 NOTE — Telephone Encounter (Signed)
done

## 2022-06-19 NOTE — Telephone Encounter (Signed)
[facility] calling about patient's mail order prescription. Says it's a 90 day supply for farxiga; phone number for mail order is [phone].

## 2022-06-19 NOTE — Telephone Encounter (Signed)
From pt

## 2022-06-19 NOTE — Telephone Encounter (Signed)
Please see message from the patient below. Please send Farxiga refill and offer samples in the meantime.  Thanks MJP  I have been trying to get the [place] refilled on 90 days supply starting on August 29th - [employer] and [facility] have been faxing the prior authorization for my insurance and as of today I have called your office and spoke with Verlon Au and she says not received. I only have 3 pills least as of today  Could someone call Heather at [facility] and get resolved . [phone]  Not sure if all of this will be fixed within the 3 days - fyi  Thank you !

## 2022-06-22 ENCOUNTER — Other Ambulatory Visit: Payer: Self-pay

## 2022-06-22 DIAGNOSIS — I5020 Unspecified systolic (congestive) heart failure: Secondary | ICD-10-CM

## 2022-06-22 MED ORDER — SACUBITRIL-VALSARTAN 49-51 MG PO TABS: 1.0000 | ORAL_TABLET | Freq: Two times a day (BID) | ORAL | 3 refills | Status: DC

## 2022-06-25 ENCOUNTER — Encounter: Payer: Self-pay | Admitting: Cardiology

## 2022-06-26 NOTE — Telephone Encounter (Signed)
From pt

## 2022-07-12 ENCOUNTER — Ambulatory Visit: Payer: 59

## 2022-07-12 DIAGNOSIS — I351 Nonrheumatic aortic (valve) insufficiency: Secondary | ICD-10-CM

## 2022-07-14 ENCOUNTER — Encounter: Payer: Self-pay | Admitting: Obstetrics and Gynecology

## 2022-07-20 ENCOUNTER — Encounter: Payer: Self-pay | Admitting: Cardiology

## 2022-07-20 NOTE — Telephone Encounter (Signed)
From patient.

## 2022-07-20 NOTE — Telephone Encounter (Signed)
Atrial dilatation is reflective of prior mitral regurgitation and has in fact improved.  Aortic regurgitation has improved.  Given that the heart function has improved on the above medications, I would continue at least for near future. Lolita Patella, please look into patient assistance/coupon/samples.  Thanks MJP
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
## 2022-09-11 ENCOUNTER — Other Ambulatory Visit (HOSPITAL_COMMUNITY)
Admission: RE | Admit: 2022-09-11 | Discharge: 2022-09-11 | Disposition: A | Discharge status: Home / Self Care | Payer: 59 | Source: Ambulatory Visit | Attending: Obstetrics and Gynecology | Admitting: Obstetrics and Gynecology

## 2022-09-11 ENCOUNTER — Ambulatory Visit (INDEPENDENT_AMBULATORY_CARE_PROVIDER_SITE_OTHER): Payer: 59 | Admitting: Obstetrics and Gynecology

## 2022-09-11 ENCOUNTER — Encounter: Payer: Self-pay | Admitting: Obstetrics and Gynecology

## 2022-09-11 VITALS — BP 110/78 | Ht 66.0 in | Wt 132.0 lb

## 2022-09-11 DIAGNOSIS — Z124 Encounter for screening for malignant neoplasm of cervix: Secondary | ICD-10-CM

## 2022-09-11 DIAGNOSIS — Z01419 Encounter for gynecological examination (general) (routine) without abnormal findings: Secondary | ICD-10-CM | POA: Diagnosis not present

## 2022-09-11 NOTE — Patient Instructions (Signed)

## 2022-09-13 LAB — CYTOLOGY - PAP
Comment: NEGATIVE
Diagnosis: NEGATIVE
High risk HPV: NEGATIVE

## 2022-10-12 ENCOUNTER — Other Ambulatory Visit: Payer: Self-pay | Admitting: Cardiology

## 2022-10-29 ENCOUNTER — Other Ambulatory Visit: Payer: Self-pay | Admitting: Obstetrics and Gynecology

## 2022-10-29 DIAGNOSIS — Z1231 Encounter for screening mammogram for malignant neoplasm of breast: Secondary | ICD-10-CM

## 2022-11-07 DIAGNOSIS — E785 Hyperlipidemia, unspecified: Secondary | ICD-10-CM | POA: Diagnosis not present

## 2022-11-07 DIAGNOSIS — R7989 Other specified abnormal findings of blood chemistry: Secondary | ICD-10-CM | POA: Diagnosis not present

## 2022-11-07 DIAGNOSIS — R002 Palpitations: Secondary | ICD-10-CM | POA: Diagnosis not present

## 2022-11-07 DIAGNOSIS — M8580 Other specified disorders of bone density and structure, unspecified site: Secondary | ICD-10-CM | POA: Diagnosis not present

## 2022-11-15 DIAGNOSIS — R82998 Other abnormal findings in urine: Secondary | ICD-10-CM | POA: Diagnosis not present

## 2022-11-16 ENCOUNTER — Other Ambulatory Visit: Payer: Self-pay

## 2022-11-16 ENCOUNTER — Other Ambulatory Visit: Payer: Self-pay | Admitting: Internal Medicine

## 2022-11-16 DIAGNOSIS — F17201 Nicotine dependence, unspecified, in remission: Secondary | ICD-10-CM

## 2022-11-16 DIAGNOSIS — I5020 Unspecified systolic (congestive) heart failure: Secondary | ICD-10-CM

## 2022-11-16 MED ORDER — SACUBITRIL-VALSARTAN 49-51 MG PO TABS: 1.0000 | ORAL_TABLET | Freq: Two times a day (BID) | ORAL | 3 refills | Status: DC

## 2022-11-23 ENCOUNTER — Other Ambulatory Visit: Payer: Self-pay | Admitting: Cardiology

## 2022-11-23 DIAGNOSIS — I5020 Unspecified systolic (congestive) heart failure: Secondary | ICD-10-CM

## 2022-11-26 ENCOUNTER — Other Ambulatory Visit: Payer: Self-pay

## 2022-11-26 DIAGNOSIS — I5020 Unspecified systolic (congestive) heart failure: Secondary | ICD-10-CM

## 2022-11-26 MED ORDER — DAPAGLIFLOZIN PROPANEDIOL 10 MG PO TABS: 10.0000 mg | ORAL_TABLET | Freq: Every day | ORAL | 2 refills | Status: DC

## 2022-12-12 ENCOUNTER — Encounter: Payer: Self-pay | Admitting: Internal Medicine

## 2022-12-17 ENCOUNTER — Ambulatory Visit
Admission: RE | Admit: 2022-12-17 | Discharge: 2022-12-17 | Disposition: A | Discharge status: Home / Self Care | Payer: 59 | Source: Ambulatory Visit | Attending: Internal Medicine | Admitting: Internal Medicine

## 2022-12-17 DIAGNOSIS — F17201 Nicotine dependence, unspecified, in remission: Secondary | ICD-10-CM

## 2022-12-17 DIAGNOSIS — Z9889 Other specified postprocedural states: Secondary | ICD-10-CM | POA: Diagnosis not present

## 2022-12-17 DIAGNOSIS — Z87891 Personal history of nicotine dependence: Secondary | ICD-10-CM | POA: Diagnosis not present

## 2022-12-17 DIAGNOSIS — R918 Other nonspecific abnormal finding of lung field: Secondary | ICD-10-CM | POA: Diagnosis not present

## 2022-12-17 DIAGNOSIS — J432 Centrilobular emphysema: Secondary | ICD-10-CM | POA: Diagnosis not present

## 2022-12-19 ENCOUNTER — Ambulatory Visit
Admission: RE | Admit: 2022-12-19 | Discharge: 2022-12-19 | Disposition: A | Discharge status: Home / Self Care | Payer: 59 | Source: Ambulatory Visit

## 2022-12-19 DIAGNOSIS — Z1231 Encounter for screening mammogram for malignant neoplasm of breast: Secondary | ICD-10-CM

## 2023-01-01 DIAGNOSIS — Z23 Encounter for immunization: Secondary | ICD-10-CM | POA: Diagnosis not present

## 2023-01-01 DIAGNOSIS — E0590 Thyrotoxicosis, unspecified without thyrotoxic crisis or storm: Secondary | ICD-10-CM | POA: Diagnosis not present

## 2023-01-03 ENCOUNTER — Other Ambulatory Visit (HOSPITAL_COMMUNITY): Payer: Self-pay | Admitting: Internal Medicine

## 2023-01-03 DIAGNOSIS — E0590 Thyrotoxicosis, unspecified without thyrotoxic crisis or storm: Secondary | ICD-10-CM

## 2023-01-08 ENCOUNTER — Other Ambulatory Visit: Payer: Self-pay

## 2023-01-08 DIAGNOSIS — I5020 Unspecified systolic (congestive) heart failure: Secondary | ICD-10-CM

## 2023-01-14 ENCOUNTER — Encounter (HOSPITAL_COMMUNITY)
Admission: RE | Admit: 2023-01-14 | Discharge: 2023-01-14 | Disposition: A | Discharge status: Home / Self Care | Payer: 59 | Source: Ambulatory Visit | Attending: Internal Medicine | Admitting: Internal Medicine

## 2023-01-14 DIAGNOSIS — E0590 Thyrotoxicosis, unspecified without thyrotoxic crisis or storm: Secondary | ICD-10-CM | POA: Diagnosis not present

## 2023-01-14 MED ORDER — SODIUM IODIDE I-123 7.4 MBQ CAPS
444.0000 | ORAL_CAPSULE | Freq: Once | ORAL | Status: AC
  Administered 2023-01-14: 444 via ORAL

## 2023-01-15 ENCOUNTER — Encounter (HOSPITAL_COMMUNITY)
Admission: RE | Admit: 2023-01-15 | Discharge: 2023-01-15 | Disposition: A | Discharge status: Home / Self Care | Payer: 59 | Source: Ambulatory Visit | Attending: Internal Medicine | Admitting: Internal Medicine

## 2023-01-16 DIAGNOSIS — E0590 Thyrotoxicosis, unspecified without thyrotoxic crisis or storm: Secondary | ICD-10-CM | POA: Diagnosis not present

## 2023-01-24 ENCOUNTER — Other Ambulatory Visit: Payer: Self-pay | Admitting: Internal Medicine

## 2023-01-24 DIAGNOSIS — E041 Nontoxic single thyroid nodule: Secondary | ICD-10-CM

## 2023-01-31 ENCOUNTER — Ambulatory Visit
Admission: RE | Admit: 2023-01-31 | Discharge: 2023-01-31 | Disposition: A | Discharge status: Home / Self Care | Payer: 59 | Source: Ambulatory Visit | Attending: Internal Medicine | Admitting: Internal Medicine

## 2023-01-31 DIAGNOSIS — E041 Nontoxic single thyroid nodule: Secondary | ICD-10-CM

## 2023-02-05 ENCOUNTER — Ambulatory Visit: Payer: 59

## 2023-02-05 DIAGNOSIS — I5020 Unspecified systolic (congestive) heart failure: Secondary | ICD-10-CM | POA: Diagnosis not present

## 2023-02-11 ENCOUNTER — Encounter: Payer: Self-pay | Admitting: Cardiology

## 2023-02-12 NOTE — Telephone Encounter (Signed)
From patient.

## 2023-02-13 DIAGNOSIS — M170 Bilateral primary osteoarthritis of knee: Secondary | ICD-10-CM | POA: Diagnosis not present

## 2023-02-13 DIAGNOSIS — M1712 Unilateral primary osteoarthritis, left knee: Secondary | ICD-10-CM | POA: Diagnosis not present

## 2023-02-13 DIAGNOSIS — M1711 Unilateral primary osteoarthritis, right knee: Secondary | ICD-10-CM | POA: Diagnosis not present

## 2023-02-15 DIAGNOSIS — E0590 Thyrotoxicosis, unspecified without thyrotoxic crisis or storm: Secondary | ICD-10-CM | POA: Diagnosis not present

## 2023-02-15 DIAGNOSIS — E785 Hyperlipidemia, unspecified: Secondary | ICD-10-CM | POA: Diagnosis not present

## 2023-02-17 NOTE — Telephone Encounter (Signed)
I haven't seen her in a while. It will be good to discuss her questions in a follow up visit.  Thanks MJP

## 2023-02-18 ENCOUNTER — Other Ambulatory Visit: Payer: 59

## 2023-02-18 NOTE — Telephone Encounter (Signed)
Sure  02/18/23

## 2023-02-18 NOTE — Telephone Encounter (Signed)
From patient.

## 2023-03-12 ENCOUNTER — Other Ambulatory Visit: Payer: Self-pay

## 2023-03-12 DIAGNOSIS — I5020 Unspecified systolic (congestive) heart failure: Secondary | ICD-10-CM

## 2023-03-12 MED ORDER — SACUBITRIL-VALSARTAN 49-51 MG PO TABS: 1.0000 | ORAL_TABLET | Freq: Two times a day (BID) | ORAL | 3 refills | Status: DC

## 2023-03-29 ENCOUNTER — Other Ambulatory Visit: Payer: Self-pay

## 2023-03-29 DIAGNOSIS — I5020 Unspecified systolic (congestive) heart failure: Secondary | ICD-10-CM

## 2023-03-29 MED ORDER — DAPAGLIFLOZIN PROPANEDIOL 10 MG PO TABS
10.0000 mg | ORAL_TABLET | Freq: Every day | ORAL | 2 refills | Status: DC
Start: 2023-03-29 — End: 2023-08-30

## 2023-04-14 ENCOUNTER — Other Ambulatory Visit: Payer: Self-pay | Admitting: Cardiology

## 2023-06-07 ENCOUNTER — Other Ambulatory Visit: Payer: Self-pay | Admitting: Cardiology

## 2023-08-22 ENCOUNTER — Other Ambulatory Visit: Payer: Self-pay | Admitting: Cardiology

## 2023-08-22 ENCOUNTER — Telehealth: Payer: Self-pay | Admitting: Cardiology

## 2023-08-22 MED ORDER — METOPROLOL SUCCINATE ER 25 MG PO TB24: 12.5000 mg | ORAL_TABLET | Freq: Two times a day (BID) | ORAL | 0 refills | Status: DC

## 2023-08-22 NOTE — Telephone Encounter (Signed)
*  STAT* If patient is at the pharmacy, call can be transferred to refill team.   1. Which medications need to be refilled? (please list name of each medication and dose if known) metoprolol succinate (TOPROL-XL) 25 MG 24 hr tablet   2. Which pharmacy/location (including street and city if local pharmacy) is medication to be sent to?  [id number] [place], [place] - [address]      3. Do they need a 30 day or 90 day supply? 90 day   Pt is out of medication

## 2023-08-22 NOTE — Telephone Encounter (Signed)
Pt's medication was sent to pt's pharmacy as requested. Confirmation received.  °

## 2023-08-26 ENCOUNTER — Telehealth: Payer: Self-pay | Admitting: Cardiology

## 2023-08-26 ENCOUNTER — Encounter: Payer: Self-pay | Admitting: Cardiology

## 2023-08-26 NOTE — Telephone Encounter (Signed)
*  STAT* If patient is at the pharmacy, call can be transferred to refill team.   1. Which medications need to be refilled? (please list name of each medication and dose if known)   metoprolol succinate (TOPROL-XL) 25 MG 24 hr tablet   2. Would you like to learn more about the convenience, safety, & potential cost savings by using the [facility]?   3. Are you open to using the [facility] (Type [facility]. )   4. Which pharmacy/location (including street and city if local pharmacy) is medication to be sent to?  [id number] [place], [place] - [address]   5. Do they need a 30 day or 90 day supply?   90 day  Patient stated she still has medication left.  Patient has appointment on 12/25/23.

## 2023-08-26 NOTE — Telephone Encounter (Signed)
 Pt is requesting too soon

## 2023-08-27 NOTE — Telephone Encounter (Signed)
If it does not work out, we could switch to [employer] as it has similar cardiac benefits like [place]. Please keep me in the loop.  Thanks MJP

## 2023-08-29 ENCOUNTER — Telehealth: Payer: Self-pay | Admitting: Pharmacy Technician

## 2023-08-29 ENCOUNTER — Other Ambulatory Visit (HOSPITAL_COMMUNITY): Payer: Self-pay

## 2023-08-29 ENCOUNTER — Telehealth: Payer: Self-pay | Admitting: Cardiology

## 2023-08-29 NOTE — Telephone Encounter (Signed)
Pharmacy Patient Advocate Encounter   Received notification from Pt Calls Messages that prior authorization for farxiga is required/requested.   Insurance verification completed.   The patient is insured through  [employer]  .   Per test claim: PA required; PA submitted to above mentioned insurance via CoverMyMeds Key/confirmation #/EOC [id number] Status is pending

## 2023-08-29 NOTE — Telephone Encounter (Signed)
Spoke with patient and she states her insurance states she will need another prior [place] for Farxiga 10 mg

## 2023-08-29 NOTE — Telephone Encounter (Signed)
Spoke with patient she is aware PA has been started

## 2023-08-29 NOTE — Telephone Encounter (Signed)
Pt also states she is still waiting on prior authorization. Please advise

## 2023-08-29 NOTE — Telephone Encounter (Signed)
Pt states she would like to speak with Judeth Cornfield because they have been talking on my Chart. Please advise

## 2023-08-30 ENCOUNTER — Telehealth: Payer: Self-pay | Admitting: Cardiology

## 2023-08-30 MED ORDER — EMPAGLIFLOZIN 10 MG PO TABS: 10.0000 mg | ORAL_TABLET | Freq: Every day | ORAL | 11 refills | Status: AC

## 2023-08-30 NOTE — Telephone Encounter (Signed)
Spoke with and Rep from [facility]- the PA for Vanessa Sanchez was denied because there is no information if the patient has tried jardiance first. I looked and it did not look like the pt has tried [place] first. [name] is preferred- so the pt must try and "fail" or there must be documentation from the provider explaining why the Vanessa Sanchez needs to be given instead of the Jardiance- must support why this is the better/preferred choice for the patient.  Asked if there needs to be a PA for Jardiance and was told that there would need to be a PA for the [place], but it would "likely" be approved due to it being preferred.

## 2023-08-30 NOTE — Addendum Note (Signed)
Addended by: Scheryl Marten on: 08/30/2023 04:57 PM   Modules accepted: Orders

## 2023-08-30 NOTE — Telephone Encounter (Signed)
Vanessa Sanchez from [place] states she is returning call in regards to a message she had received requesting # for PA Department for [employer].    [phone] - PA Department

## 2023-08-30 NOTE — Telephone Encounter (Signed)
Called the patient- gave her the information from insurance company. She said she would be ok with switching to Jardiance as long as it is ok with her Doctor- she just wanted to be sure this medication is ok with the rest of her medications and with her Diagnoses.  Informed her that I will send all of this information to her provider. She plans to follow up with [place] on Tuesday if she has not heard anything from [place].   the pt[id number] [place], [place] - [address]

## 2023-08-30 NOTE — Telephone Encounter (Signed)
Patient is aware and states her insurance wants her to switch to jardiance. She states Orpha Bur called her and informed her jardiance have been sent to pharmacy.

## 2023-08-30 NOTE — Telephone Encounter (Signed)
Calling about Prior Auth for Vanessa Sanchez- it was denied[employer] said that they need to speak with someone on the phone because there is information that is missing  The number to call insurance company- [phone]-

## 2023-08-30 NOTE — Telephone Encounter (Signed)
Pharmacy Patient Advocate Encounter  Received notification from  [employer]  that Prior Authorization forfarxiga has been DENIED.  Full denial letter will be uploaded to the media tab. See denial reason below. Plan prefers jardiance   [place] #/Case ID/Reference #: [id number]

## 2023-08-30 NOTE — Telephone Encounter (Signed)
Pt calling back about prior auth. Per pt it is missing information. Pt would like a c/b today

## 2023-08-30 NOTE — Telephone Encounter (Signed)
Yes, okay with me.  Thanks MJP

## 2023-09-02 ENCOUNTER — Other Ambulatory Visit: Payer: Self-pay | Admitting: Cardiology

## 2023-09-02 ENCOUNTER — Telehealth: Payer: Self-pay

## 2023-09-02 ENCOUNTER — Other Ambulatory Visit (HOSPITAL_COMMUNITY): Payer: Self-pay

## 2023-09-02 NOTE — Telephone Encounter (Signed)
Pharmacy Patient Advocate Encounter  Received notification from [facility] that Prior Authorization for JARDIANCE has been APPROVED from 09/01/23 to 08/31/24

## 2023-09-02 NOTE — Telephone Encounter (Signed)
PA request has been Submitted. New Encounter created for follow up. For additional info see Pharmacy Prior Auth telephone encounter from 09/02/23.

## 2023-09-02 NOTE — Telephone Encounter (Signed)
Pharmacy Patient Advocate Encounter   Received notification from Physician's Office that prior authorization for JARDIANCE is required/requested.   Insurance verification completed.   The patient is insured through [facility] .   Per test claim: PA required; PA submitted to above mentioned insurance via CoverMyMeds Key/confirmation #/EOC [id number] Status is pending

## 2023-09-02 NOTE — Telephone Encounter (Signed)
Pts farxiga was discontinued and Jardiance 10 mg po daily before breakfast was sent to her preferred pharmacy of choice, by [employer].  RN also sent this message to our prior auth rx team to further manage newly sent in [place].

## 2023-11-07 DIAGNOSIS — R051 Acute cough: Secondary | ICD-10-CM | POA: Diagnosis not present

## 2023-11-07 DIAGNOSIS — J0140 Acute pansinusitis, unspecified: Secondary | ICD-10-CM | POA: Diagnosis not present
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
## 2023-11-14 ENCOUNTER — Other Ambulatory Visit: Payer: Self-pay | Admitting: Internal Medicine

## 2023-11-14 DIAGNOSIS — F17201 Nicotine dependence, unspecified, in remission: Secondary | ICD-10-CM

## 2023-11-14 DIAGNOSIS — J439 Emphysema, unspecified: Secondary | ICD-10-CM

## 2023-11-18 DIAGNOSIS — Z0000 Encounter for general adult medical examination without abnormal findings: Secondary | ICD-10-CM | POA: Diagnosis not present

## 2023-11-18 DIAGNOSIS — M852 Hyperostosis of skull: Secondary | ICD-10-CM | POA: Diagnosis not present

## 2023-11-18 DIAGNOSIS — E785 Hyperlipidemia, unspecified: Secondary | ICD-10-CM | POA: Diagnosis not present

## 2023-11-18 DIAGNOSIS — I5022 Chronic systolic (congestive) heart failure: Secondary | ICD-10-CM | POA: Diagnosis not present

## 2023-11-18 DIAGNOSIS — E0590 Thyrotoxicosis, unspecified without thyrotoxic crisis or storm: Secondary | ICD-10-CM | POA: Diagnosis not present

## 2023-11-19 LAB — LAB REPORT - SCANNED: EGFR: 88

## 2023-11-25 ENCOUNTER — Other Ambulatory Visit: Payer: Self-pay | Admitting: Obstetrics and Gynecology

## 2023-11-25 DIAGNOSIS — Z1231 Encounter for screening mammogram for malignant neoplasm of breast: Secondary | ICD-10-CM

## 2023-11-26 ENCOUNTER — Ambulatory Visit (INDEPENDENT_AMBULATORY_CARE_PROVIDER_SITE_OTHER): Payer: 59 | Admitting: Obstetrics and Gynecology

## 2023-11-26 ENCOUNTER — Encounter: Payer: Self-pay | Admitting: Obstetrics and Gynecology

## 2023-11-26 VITALS — BP 108/62 | HR 71 | Ht 66.5 in | Wt 141.0 lb

## 2023-11-26 DIAGNOSIS — Z01419 Encounter for gynecological examination (general) (routine) without abnormal findings: Secondary | ICD-10-CM

## 2023-11-26 DIAGNOSIS — Z1331 Encounter for screening for depression: Secondary | ICD-10-CM

## 2023-11-26 NOTE — Patient Instructions (Signed)

## 2023-11-30 ENCOUNTER — Other Ambulatory Visit: Payer: Self-pay | Admitting: Cardiology

## 2023-12-02 ENCOUNTER — Other Ambulatory Visit: Payer: Self-pay

## 2023-12-02 MED ORDER — METOPROLOL SUCCINATE ER 25 MG PO TB24: 12.5000 mg | ORAL_TABLET | Freq: Two times a day (BID) | ORAL | 0 refills | Status: DC

## 2023-12-05 ENCOUNTER — Encounter: Payer: Self-pay | Admitting: Cardiology

## 2023-12-05 NOTE — Telephone Encounter (Signed)
 I am not able to see the recent blood work, please see if he can get from PCP.  We can go ahead and get the echocardiogram on March 19.  As long as we have the lab results by then, I will go over all the medications and echocardiogram results with the patient on March 19.  Please make sure there is at least 1 hour gap between her echocardiogram and office visit so that I will have a chance to review the echocardiogram.  Thanks MJP

## 2023-12-06 ENCOUNTER — Other Ambulatory Visit: Payer: Self-pay

## 2023-12-06 DIAGNOSIS — I5020 Unspecified systolic (congestive) heart failure: Secondary | ICD-10-CM

## 2023-12-11 ENCOUNTER — Encounter: Payer: Self-pay | Admitting: Internal Medicine

## 2023-12-24 ENCOUNTER — Ambulatory Visit
Admission: RE | Admit: 2023-12-24 | Discharge: 2023-12-24 | Disposition: A | Discharge status: Home / Self Care | Payer: 59 | Source: Ambulatory Visit | Attending: Internal Medicine | Admitting: Internal Medicine

## 2023-12-24 DIAGNOSIS — Z87891 Personal history of nicotine dependence: Secondary | ICD-10-CM | POA: Diagnosis not present

## 2023-12-24 DIAGNOSIS — Z122 Encounter for screening for malignant neoplasm of respiratory organs: Secondary | ICD-10-CM | POA: Diagnosis not present

## 2023-12-24 DIAGNOSIS — J439 Emphysema, unspecified: Secondary | ICD-10-CM

## 2023-12-24 DIAGNOSIS — F17201 Nicotine dependence, unspecified, in remission: Secondary | ICD-10-CM

## 2023-12-25 ENCOUNTER — Ambulatory Visit: Payer: 59 | Attending: Cardiology | Admitting: Cardiology

## 2023-12-25 ENCOUNTER — Ambulatory Visit
Admission: RE | Admit: 2023-12-25 | Discharge: 2023-12-25 | Disposition: A | Discharge status: Home / Self Care | Payer: 59 | Source: Ambulatory Visit

## 2023-12-25 ENCOUNTER — Ambulatory Visit (HOSPITAL_BASED_OUTPATIENT_CLINIC_OR_DEPARTMENT_OTHER): Payer: 59

## 2023-12-25 ENCOUNTER — Encounter: Payer: Self-pay | Admitting: Cardiology

## 2023-12-25 VITALS — BP 110/70 | HR 69 | Ht 67.0 in | Wt 140.2 lb

## 2023-12-25 DIAGNOSIS — E782 Mixed hyperlipidemia: Secondary | ICD-10-CM

## 2023-12-25 DIAGNOSIS — R718 Other abnormality of red blood cells: Secondary | ICD-10-CM

## 2023-12-25 DIAGNOSIS — Z1231 Encounter for screening mammogram for malignant neoplasm of breast: Secondary | ICD-10-CM

## 2023-12-25 DIAGNOSIS — I340 Nonrheumatic mitral (valve) insufficiency: Secondary | ICD-10-CM | POA: Diagnosis not present

## 2023-12-25 DIAGNOSIS — I5020 Unspecified systolic (congestive) heart failure: Secondary | ICD-10-CM | POA: Insufficient documentation

## 2023-12-25 DIAGNOSIS — I5032 Chronic diastolic (congestive) heart failure: Secondary | ICD-10-CM | POA: Diagnosis not present

## 2023-12-25 LAB — ECHOCARDIOGRAM COMPLETE
Area-P 1/2: 4.46 cm2
P 1/2 time: 462 ms
S' Lateral: 3.4 cm

## 2023-12-25 MED ORDER — METOPROLOL SUCCINATE ER 25 MG PO TB24: 12.5000 mg | ORAL_TABLET | Freq: Every day | ORAL | 3 refills | Status: AC

## 2023-12-25 NOTE — Progress Notes (Signed)
 Cardiology Office Note:  .   Date:  12/25/2023  ID:  Vanessa Sanchez, DOB 25-Sep-1961, MRN [id number] PCP: Cleatis Polka., MD  [facility] Providers Cardiologist:  Truett Mainland, MD PCP: Cleatis Polka., MD  Chief Complaint  Patient presents with   Mitral valve repair      History of Present Illness: .    Vanessa Sanchez is a 63 y.o. female with h/o ASD repair (1988), primary MR, TR, now s/p MVR, TVR and left intra-atrial CryoMaze via right thoracotomy by Dr Ty Hilts (09/2021)  Patient had tumultuous postop.  With expected drop in EF after mitral valve repair, but also occurrence aortic regurgitation.  Both issues have significantly improved, reviewed echocardiogram results with the patient, details below.  Patient is feeling very well, does not have any overt symptoms of exertional dyspnea, orthopnea, PND.  She had only been taking metoprolol succinate 12.5 mg daily, and Entresto 49-50 mg-only once daily, along with Jardiance 10 mg daily.  Reviewed recent lab results with the patient, details below.  Patient lives in [address] and will be transferring her insurance over to [place] this April.  She is looking to establish care with a cardiologist in [place] soon.     Vitals:   12/25/23 1413  BP: 110/70  Pulse: 69  SpO2: 96%     ROS:  Review of Systems  Cardiovascular:  Negative for chest pain, dyspnea on exertion, leg swelling, palpitations and syncope.     Studies Reviewed: Marland Kitchen        EKG 12/25/2023: Normal sinus rhythm Nonspecific ST and T wave abnormality No previous ECGs available    Echocardiogram 12/25/2023: LVEF 60 to 65%.  Indeterminate diastolic function in the setting of prior mitral valve repair. Mild to moderate left atrial dilatation. Intact ASD repair. S/p mitral valve repair, trivial MR. Trivial AI.   Independently interpreted 11/2023: Chol 248, TG 106, HDL 78, LDL 152 MCV 100    Physical Exam:   Physical  Exam Vitals and nursing note reviewed.  Constitutional:      General: She is not in acute distress. Neck:     Vascular: No JVD.  Cardiovascular:     Rate and Rhythm: Normal rate and regular rhythm.     Heart sounds: Normal heart sounds. No murmur heard. Pulmonary:     Effort: Pulmonary effort is normal.     Breath sounds: Normal breath sounds. No wheezing or rales.  Musculoskeletal:     Right lower leg: No edema.     Left lower leg: No edema.      VISIT DIAGNOSES:   ICD-10-CM   1. Elevated MCV  R71.8 Vitamin B12    Vitamin B12    CANCELED: Vitamin B12    2. Heart failure with improved ejection fraction (HFimpEF) (HCC)  I50.32 EKG 12-Lead    EKG 12-Lead    3. Mixed hyperlipidemia  E78.2 Lipid panel    Lipid panel    CANCELED: Lipid panel    4. Nonrheumatic mitral valve regurgitation  I34.0 ECHOCARDIOGRAM LIMITED    CANCELED: ECHOCARDIOGRAM LIMITED   S/p MVR, TVR       ASSESSMENT AND PLAN: .    Vanessa Sanchez is a 63 y.o. female with h/o ASD repair (1988), primary MR, TR, now s/p MVR, TVR and left intra-atrial CryoMaze via right thoracotomy by Dr Ty Hilts (09/2021)  MR, TR: Resolved since mitral and tricuspid valve repair in 2022. Continue aspirin 81 mg daily. With no recurrence  of A-fib since mitral valve repair, reasonable to omit anticoagulation. Drop in EF to 40%, has now normalized. She was only taking Entresto 49-51 mg once daily, along with metoprolol succinate 12.5 mg daily and Jardiance 10 mg daily.  I do not quite think [name] has played a usual in her LVEF improvement.  I reckon it is more related to her LV recovery over period of time after mitral and tricuspid valve repair. I am okay with her coming off Entresto To be sure, I would recommend limited echocardiogram in 3 months to ensure no drop in EF, along with monitoring for symptoms of dyspnea, orthopnea, etc. With patient moving to [place], this would likely get performed in [place].  If  she has not establish care with a cardiologist in [place] by then, she would get the echocardiogram done here at [facility].  H/o ASD repair: Intact by recent echocardiogram 07/2019.   Mixed hyperlipidemia: Elevated LDL without any calcium noted on serial CT scans for lung cancer screening. Discussed diet and lifestyle modifications. Repeat lipid panel in 3 months.  If no improvement, could consider addition of statin therapy.  Elevated MCV: Noted on recent blood work with the PCP.  No anemia noted. I will check her vitamin B12 in 3 months.  Lipid panel and vitamin B12 labs can performed at [facility] in [place].  Overall, I am very pleased with her recovery.  I have asked her well and recommended establishing cardiac care in [place] for future purposes.         No orders of the defined types were placed in this encounter.    F/u as needed  Signed, Elder Negus, MD

## 2023-12-25 NOTE — Patient Instructions (Addendum)
 Medication Instructions:  STOP ENTRESTO  CHANGED METOPROLOL TO 0.5 TABLET ONCE DAILY   *If you need a refill on your cardiac medications before your next appointment, please call your pharmacy*   Lab Work IN 3 MONTHS: Lipid pane Vitamin b12   If you have labs (blood work) drawn today and your tests are completely normal, you will receive your results only by: MyChart Message (if you have MyChart) OR A paper copy in the mail If you have any lab test that is abnormal or we need to change your treatment, we will call you to review the results.   Testing/Procedures: LIMITED ECHO IN 3 MONTHS   Your physician has requested that you have an echocardiogram. Echocardiography is a painless test that uses sound waves to create images of your heart. It provides your doctor with information about the size and shape of your heart and how well your heart's chambers and valves are working. This procedure takes approximately one hour. There are no restrictions for this procedure. Please do NOT wear cologne, perfume, aftershave, or lotions (deodorant is allowed). Please arrive 15 minutes prior to your appointment time.  Please note: We ask at that you not bring children with you during ultrasound (echo/ vascular) testing. Due to room size and safety concerns, children are not allowed in the ultrasound rooms during exams. Our front office staff cannot provide observation of children in our lobby area while testing is being conducted. An adult accompanying a patient to their appointment will only be allowed in the ultrasound room at the discretion of the ultrasound technician under special circumstances. We apologize for any inconvenience.    Follow-Up: At [facility], you and your health needs are our priority.  As part of our continuing mission to provide you with exceptional heart care, we have created designated Provider Care Teams.  These Care Teams include your primary Cardiologist  (physician) and Advanced Practice Providers (APPs -  Physician Assistants and Nurse Practitioners) who all work together to provide you with the care you need, when you need it.   Your next appointment:   AS NEEDED Provider:   Elder Negus, MD     Other Instructions     1st Floor: - Lobby - Registration  - Pharmacy  - Lab - Cafe  2nd Floor: - PV Lab - Diagnostic Testing (echo, CT, nuclear med)  3rd Floor: - Vacant  4th Floor: - TCTS (cardiothoracic surgery) - AFib Clinic - Structural Heart Clinic - Vascular Surgery  - Vascular Ultrasound  5th Floor: - [facility] (general and EP) - Clinical Pharmacy for coumadin, hypertension, lipid, weight-loss medications, and med management appointments    Valet parking services will be available as well.

## 2023-12-30 ENCOUNTER — Encounter: Payer: Self-pay | Admitting: Obstetrics and Gynecology

## 2023-12-30 ENCOUNTER — Telehealth: Payer: Self-pay | Admitting: Cardiology

## 2023-12-30 NOTE — Telephone Encounter (Signed)
 Pt is calling in to speak with the nurse she would like to know what is the cash price for her limited Echo she's having in July pt wont have insurance. Pt would also like to let the nurse know she didn't sign the release form at check out due to the front desk not having the forms available. Please advise

## 2024-01-01 ENCOUNTER — Telehealth: Payer: Self-pay

## 2024-01-01 NOTE — Telephone Encounter (Signed)
 Spoke pt and she reports that she is needing cardiology records sent to Verne Spurr, MD with [employer]. The fax number is [phone]. Pt would like to be contacted when completed.

## 2024-01-27 ENCOUNTER — Encounter: Payer: Self-pay | Admitting: Cardiology

## 2024-01-27 ENCOUNTER — Other Ambulatory Visit (HOSPITAL_COMMUNITY): Payer: Self-pay

## 2024-01-27 ENCOUNTER — Telehealth: Payer: Self-pay

## 2024-01-27 NOTE — Telephone Encounter (Signed)
 Working on this, please see separate encounter

## 2024-01-27 NOTE — Telephone Encounter (Signed)
 Message from patient about Jardiance  affordability. Per test claims, patient has a really high deductible plan (over 8k). Regardless of which SGLT2 inhibitor we do, cost is going to be high even with the copay cards.  There are a few companies that will make exceptions for commercially insured patients with PAP, typically if copay is over $500 a month, which it is. Will discuss with nursing what samples they have that we can supply to patient so I know which PAP company we should start the app process on ([employer] or [employer]). Patient is aware we are working to resolve this issue. I will update patient via mychart once we have a plan.

## 2024-01-29 ENCOUNTER — Other Ambulatory Visit: Payer: Self-pay

## 2024-01-29 MED ORDER — EMPAGLIFLOZIN 10 MG PO TABS
10.0000 mg | ORAL_TABLET | Freq: Every day | ORAL | Status: AC
Start: 2024-01-29 — End: ?

## 2024-01-29 NOTE — Telephone Encounter (Signed)
 Just an [place]

## 2024-01-30 ENCOUNTER — Other Ambulatory Visit (HOSPITAL_COMMUNITY): Payer: Self-pay

## 2024-01-30 NOTE — Telephone Encounter (Signed)
 Unlikely to have application resolved in 2 weeks due to delays in PAP company turnaround. Patient will be able to pick up samples again on May 2nd if they are needed while PAP application is still pending.

## 2024-01-30 NOTE — Telephone Encounter (Signed)
 Received, thank you

## 2024-01-30 NOTE — Telephone Encounter (Signed)
 Paperwork signed and sent to [facility] for review.

## 2024-01-30 NOTE — Telephone Encounter (Signed)
 Indexed provider portion of PAP application to media. Please have Dr. Berry Bristol (DOD on 01/30/24) complete the two provider pages (sign and date) and fax back to [phone].

## 2024-02-02 IMAGING — CT CT CHEST LUNG CANCER SCREENING LOW DOSE W/O CM
1 series · 14 of 33 positions shown, 18 images · non-contrast
Comparison: Low-dose lung cancer screening chest CT 12/05/2020.

CLINICAL DATA: 60-year-old female former smoker (quit 14 years ago)
with 33 pack-year history of smoking. Lung cancer screening
examination.



[Series 2: ldct screening <30 bmi · axial · 0.72mm/px · z∈[-290,-16]mm · 14 of 65 slices shown, 18 images]
[im 5/65  mediastinal]
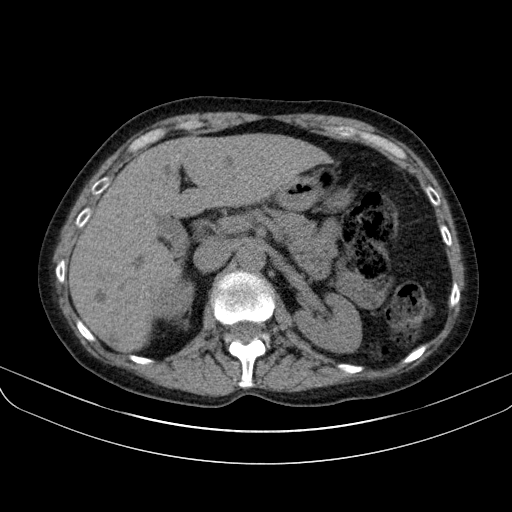
[im 5/65  lung]
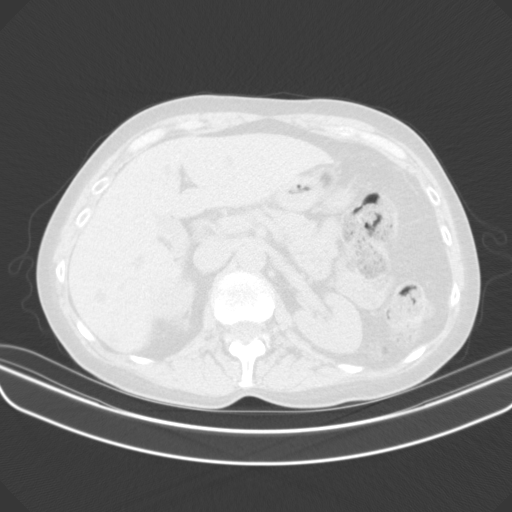
[im 10/65  lung]
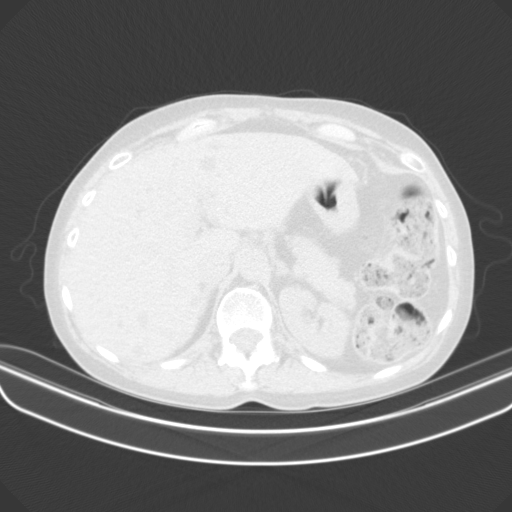
[im 13/65  lung]
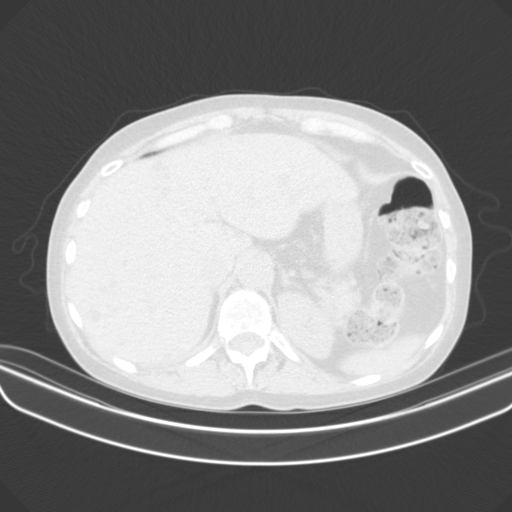
[im 17/65  lung]
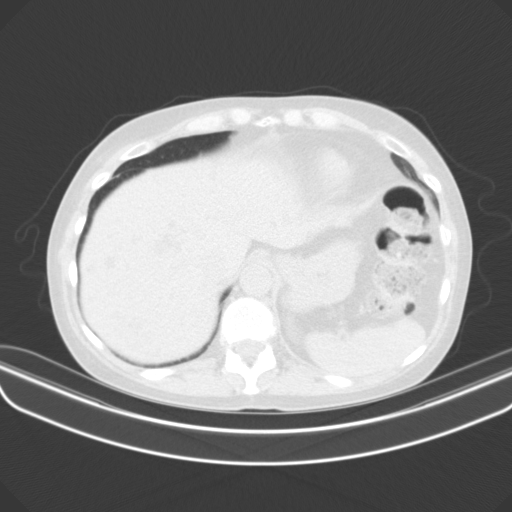
[im 22/65  mediastinal]
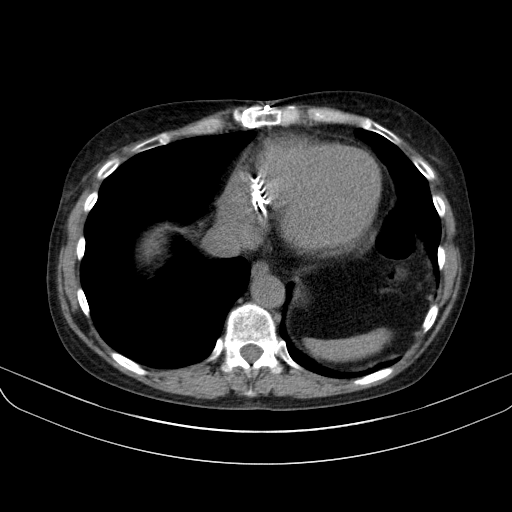
[im 22/65  lung]
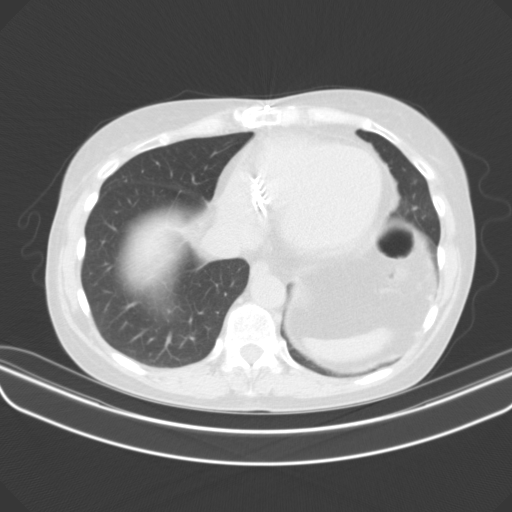
[im 27/65  lung]
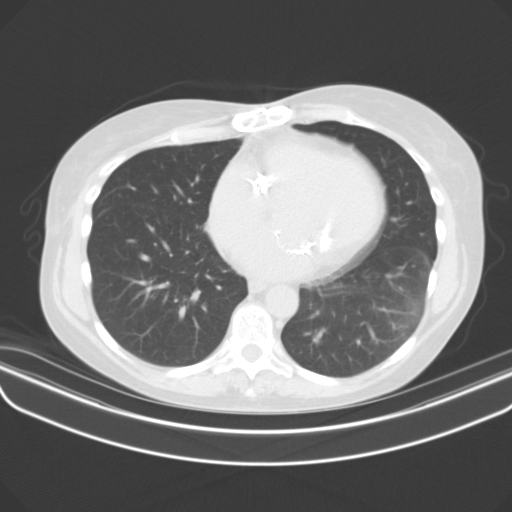
[im 31/65  lung]
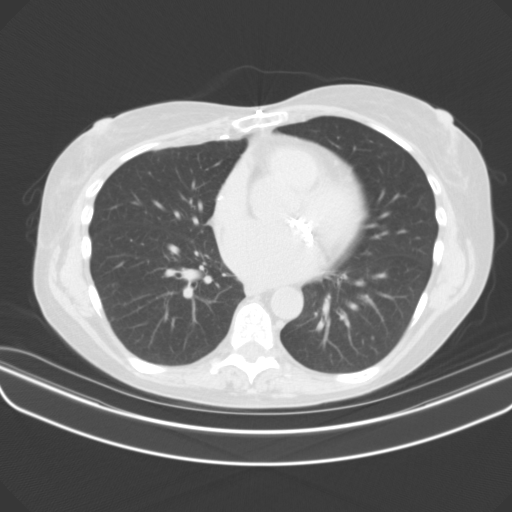
[im 35/65  lung]
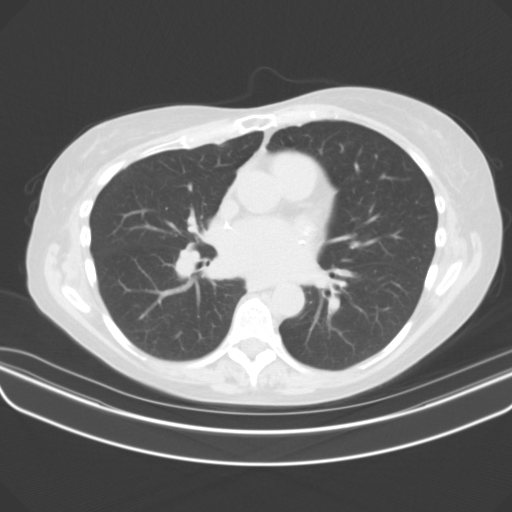
[im 38/65  mediastinal]
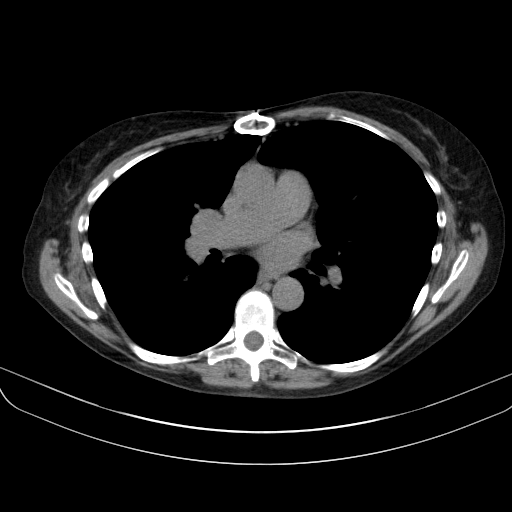
[im 38/65  lung]
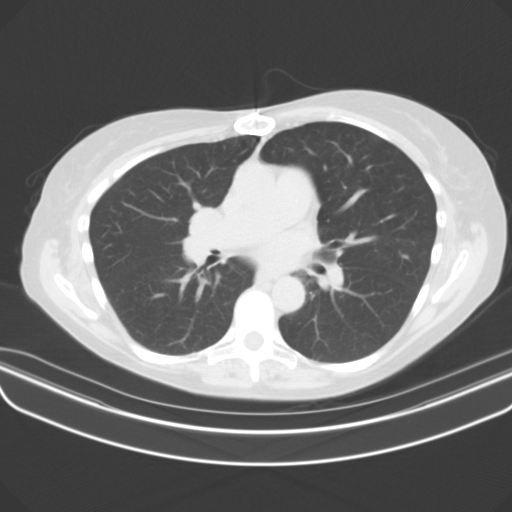
[im 43/65  lung]
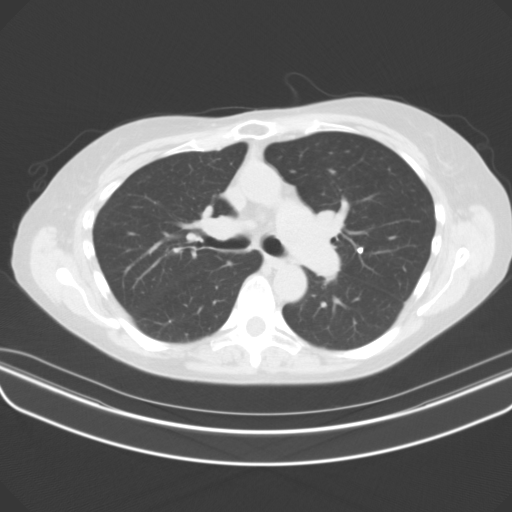
[im 48/65  lung]
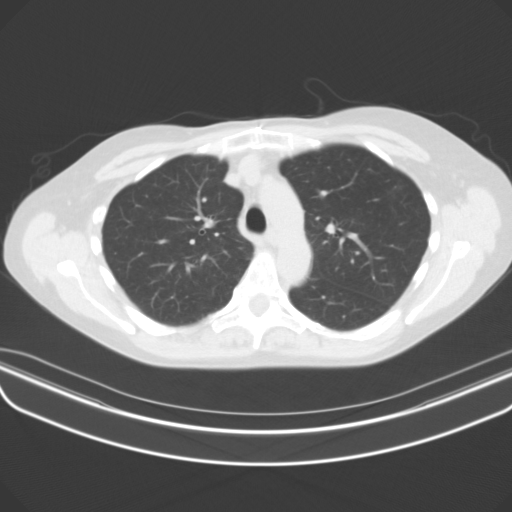
[im 52/65  lung]
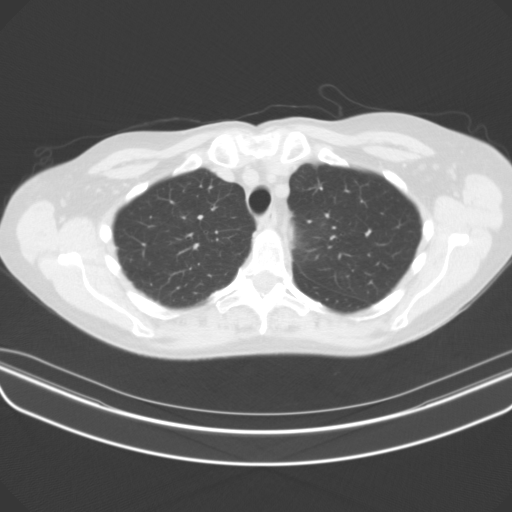
[im 55/65  mediastinal]
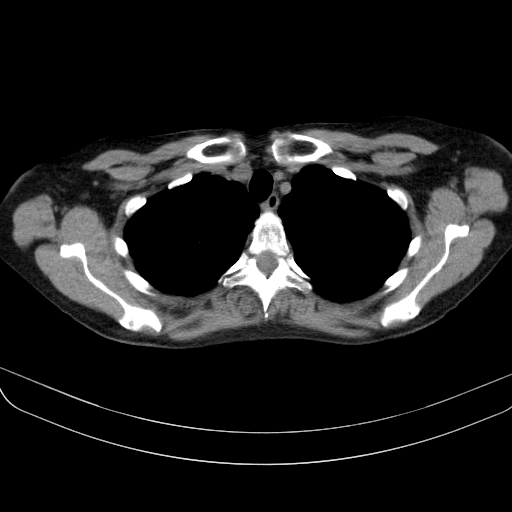
[im 55/65  lung]
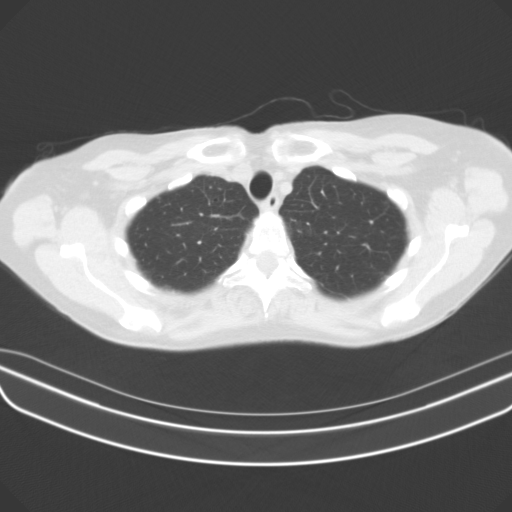
[im 60/65  lung]
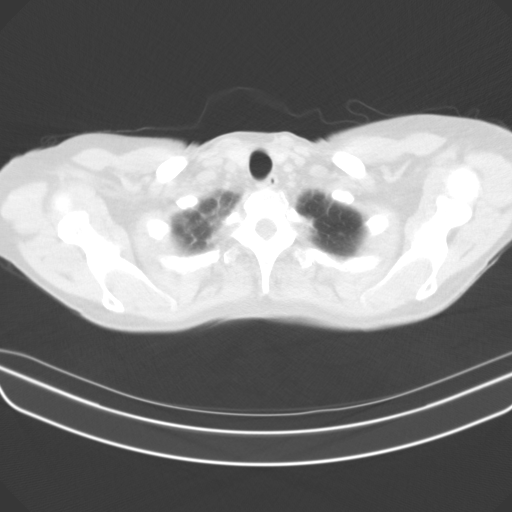

[14 of 33 positions shown; findings below may reference images not displayed]

FINDINGS: Cardiovascular: Heart size is normal. There is no significant
pericardial fluid, thickening or pericardial calcification. No
definite atherosclerotic calcifications are noted in the thoracic
aorta or the coronary arteries. Status post median sternotomy for
tricuspid annuloplasty and mitral valve repair.

Mediastinum/Nodes: No pathologically enlarged mediastinal or hilar
lymph nodes. Please note that accurate exclusion of hilar adenopathy
is limited on noncontrast CT scans. Esophagus is unremarkable in
appearance. No axillary lymphadenopathy.

Lungs/Pleura: Multiple small pulmonary nodules are again noted
scattered throughout the lungs bilaterally, largest of which is in
the apex of the left upper lobe (axial image 31 of series 3), with a
volume derived mean diameter of 3.3 mm. No larger more suspicious
appearing pulmonary nodules or masses are noted. No acute
consolidative airspace disease. No pleural effusions. Mild diffuse
bronchial wall thickening with very mild centrilobular and
paraseptal emphysema.

Upper Abdomen: Incompletely imaged lesion in the upper pole of the
right kidney measuring at least 4.1 cm is low-attenuation, and not
characterized on today's non-contrast CT examination, but
statistically likely a cyst. Innumerable small low-attenuation
lesions are again noted throughout the hepatic parenchyma, also not
characterized, but likely to represent a combination of small cysts
and/or biliary hamartomas.

Musculoskeletal: Median sternotomy wires. There are no aggressive
appearing lytic or blastic lesions noted in the visualized portions
of the skeleton.
IMPRESSION: 1. Lung-RADS 2, benign appearance or behavior. Continue annual
screening with low-dose chest CT without contrast in 12 months.
2. Mild diffuse bronchial wall thickening with very mild
centrilobular and paraseptal emphysema; imaging findings suggestive
of underlying COPD.

Emphysema ([id number]).

## 2024-02-06 ENCOUNTER — Telehealth: Payer: Self-pay

## 2024-02-06 MED ORDER — EMPAGLIFLOZIN 10 MG PO TABS: 10.0000 mg | ORAL_TABLET | Freq: Every day | ORAL | Status: AC

## 2024-02-06 NOTE — Telephone Encounter (Addendum)
 Hi! Can we please set aside 2 weeks of Jardiance  10 mg samples for patient to pick up while PAP is still pending? (copay is over $500)

## 2024-02-06 NOTE — Telephone Encounter (Signed)
 Jardiance  10 mg samples #14 placed for the pt to pick up.

## 2024-02-10 ENCOUNTER — Other Ambulatory Visit (HOSPITAL_COMMUNITY): Payer: Self-pay

## 2024-02-10 NOTE — Telephone Encounter (Signed)
 PAP: Patient has been denied for patient assistance by [employer]) due to patient is commercially insured. Letter has been sent to patient. Routing denial to clinic.

## 2024-02-10 NOTE — Telephone Encounter (Signed)
 Appear for which medication?  Thanks MJP

## 2024-02-10 NOTE — Telephone Encounter (Signed)
 Spoke with company regarding denial, we can request PAP appeal if provider is willing to sign an appeal letter. Please advise.

## 2024-02-10 NOTE — Telephone Encounter (Signed)
 Will route to the ordering MD for alternate planning and his nurse.

## 2024-02-11 ENCOUNTER — Telehealth: Payer: Self-pay

## 2024-02-11 NOTE — Telephone Encounter (Signed)
 It should be for Jardiance .

## 2024-02-11 NOTE — Telephone Encounter (Signed)
 Generated a PAP appeal letter for Jardiance  to [facility]. RPH has reviewed letter for accuracy. Letter emailed to RN to have doc sign off.

## 2024-02-11 NOTE — Telephone Encounter (Signed)
 My apologies, for her Jardiance 

## 2024-02-12 NOTE — Telephone Encounter (Addendum)
 Doc signed off on PAP appeal. Letter has been faxed to [facility].

## 2024-02-24 ENCOUNTER — Other Ambulatory Visit (HOSPITAL_COMMUNITY): Payer: Self-pay

## 2024-02-24 NOTE — Telephone Encounter (Signed)
 Noted.  Thanks MJP

## 2024-02-24 NOTE — Telephone Encounter (Signed)
 Will send this message to Dr. Filiberto Hug and covering RN, to make them aware of denial for pt assistance for Jardiance , per pt assistance team.

## 2024-02-24 NOTE — Telephone Encounter (Signed)
 PAP: Patient has been denied for patient assistance by [employer]) due to patient above income threshold for program

## 2024-02-24 NOTE — Telephone Encounter (Signed)
 Denied, see separate encounter for details.

## 2024-03-12 ENCOUNTER — Other Ambulatory Visit: Payer: Self-pay | Admitting: Cardiology

## 2024-03-12 DIAGNOSIS — I5020 Unspecified systolic (congestive) heart failure: Secondary | ICD-10-CM

## 2024-03-19 ENCOUNTER — Ambulatory Visit: Payer: Self-pay

## 2024-03-19 LAB — VITAMIN B12: Vitamin B-12: 245 pg/mL (ref 232–1245)

## 2024-03-19 LAB — LIPID PANEL
Chol/HDL Ratio: 3.4 ratio (ref 0.0–4.4)
Cholesterol, Total: 253 mg/dL — ABNORMAL HIGH (ref 100–199)
HDL: 75 mg/dL (ref >39)
LDL Chol Calc (NIH): 155 mg/dL — ABNORMAL HIGH (ref 0–99)
Triglycerides: 130 mg/dL (ref 0–149)
VLDL Cholesterol Cal: 23 mg/dL (ref 5–40)

## 2024-03-20 MED ORDER — ROSUVASTATIN CALCIUM 10 MG PO TABS: 10.0000 mg | ORAL_TABLET | Freq: Every day | ORAL | 3 refills | Status: AC

## 2024-03-20 NOTE — Progress Notes (Signed)
 Lipid panel is essentially unchanged compared to February 2025.  If patient is unable, recommend Crestor 10 mg daily with repeat lipid panel in 3 months.    Thanks MJP

## 2024-03-31 ENCOUNTER — Ambulatory Visit (HOSPITAL_COMMUNITY)
Admission: RE | Admit: 2024-03-31 | Discharge: 2024-03-31 | Disposition: A | Discharge status: Home / Self Care | Payer: Self-pay | Source: Ambulatory Visit | Attending: Internal Medicine | Admitting: Internal Medicine

## 2024-03-31 DIAGNOSIS — I340 Nonrheumatic mitral (valve) insufficiency: Secondary | ICD-10-CM

## 2024-03-31 LAB — ECHOCARDIOGRAM LIMITED
Area-P 1/2: 2.62 cm2
P 1/2 time: 379 ms
S' Lateral: 2.8 cm

## 2024-04-01 NOTE — Telephone Encounter (Signed)
 Correct.  Continue to stay off Entresto .  Continue Jardiance  and metoprolol  as you are taking. 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 Regurgitation is only mild and is not worrisome. Glad you liked the facility. I can see you after repeat lipid panel in 3 months (staff, please make the appointment).  Thanks MJP

## 2024-05-07 ENCOUNTER — Ambulatory Visit: Admitting: Cardiology

## 2024-06-22 ENCOUNTER — Ambulatory Visit: Admitting: Cardiology

## 2024-08-20 ENCOUNTER — Encounter: Payer: Self-pay | Admitting: Cardiology
